# Patient Record
Sex: Female | Born: 1935
Health system: Southern US, Community
[De-identification: ages and names within clinical notes are randomized; demographics above are authoritative.]

## PROBLEM LIST (undated history)

## (undated) DIAGNOSIS — E785 Hyperlipidemia, unspecified: Secondary | ICD-10-CM

## (undated) DIAGNOSIS — I519 Heart disease, unspecified: Secondary | ICD-10-CM

## (undated) DIAGNOSIS — R0902 Hypoxemia: Secondary | ICD-10-CM

## (undated) DIAGNOSIS — M1990 Unspecified osteoarthritis, unspecified site: Secondary | ICD-10-CM

## (undated) DIAGNOSIS — I10 Essential (primary) hypertension: Secondary | ICD-10-CM

## (undated) DIAGNOSIS — I252 Old myocardial infarction: Secondary | ICD-10-CM

## (undated) HISTORY — DX: Old myocardial infarction: I25.2

## (undated) HISTORY — DX: Heart disease, unspecified: I51.9

## (undated) HISTORY — PX: TUBAL LIGATION: SHX77

## (undated) HISTORY — DX: Hyperlipidemia, unspecified: E78.5

## (undated) HISTORY — DX: Hypoxemia: R09.02

## (undated) HISTORY — DX: Unspecified osteoarthritis, unspecified site: M19.90

---

## 2004-12-12 ENCOUNTER — Emergency Department: Payer: Self-pay | Admitting: Emergency Medicine

## 2006-11-09 ENCOUNTER — Emergency Department: Payer: Self-pay | Admitting: Internal Medicine

## 2007-02-05 ENCOUNTER — Ambulatory Visit: Payer: Self-pay | Admitting: Internal Medicine

## 2009-07-16 ENCOUNTER — Emergency Department: Payer: Self-pay | Admitting: Internal Medicine

## 2010-02-14 ENCOUNTER — Ambulatory Visit: Payer: Self-pay | Admitting: Internal Medicine

## 2010-03-03 ENCOUNTER — Inpatient Hospital Stay: Payer: Self-pay | Admitting: Internal Medicine

## 2010-03-06 ENCOUNTER — Encounter: Payer: Self-pay | Admitting: Cardiovascular Disease

## 2010-05-30 ENCOUNTER — Ambulatory Visit: Payer: Self-pay | Admitting: Gastroenterology

## 2010-12-18 ENCOUNTER — Emergency Department: Payer: Self-pay | Admitting: Emergency Medicine

## 2011-03-05 ENCOUNTER — Ambulatory Visit: Payer: Self-pay | Admitting: Internal Medicine

## 2011-12-05 ENCOUNTER — Emergency Department: Payer: Self-pay | Admitting: *Deleted

## 2011-12-05 LAB — URINALYSIS, COMPLETE
Bilirubin,UR: NEGATIVE
Blood: NEGATIVE
Glucose,UR: NEGATIVE mg/dL (ref 0–75)
Ketone: NEGATIVE
Leukocyte Esterase: NEGATIVE
Ph: 6 (ref 4.5–8.0)
Specific Gravity: 1.001 (ref 1.003–1.030)

## 2012-03-06 ENCOUNTER — Ambulatory Visit: Payer: Self-pay | Admitting: Internal Medicine

## 2012-05-03 ENCOUNTER — Inpatient Hospital Stay: Payer: Self-pay | Admitting: Student

## 2012-05-03 LAB — CBC
HGB: 13.6 g/dL (ref 12.0–16.0)
MCH: 29.3 pg (ref 26.0–34.0)
Platelet: 228 10*3/uL (ref 150–440)
RBC: 4.63 10*6/uL (ref 3.80–5.20)
WBC: 8.1 10*3/uL (ref 3.6–11.0)

## 2012-05-03 LAB — CK TOTAL AND CKMB (NOT AT ARMC)
CK, Total: 43 U/L (ref 21–215)
CK-MB: 1.1 ng/mL (ref 0.5–3.6)
CK-MB: 0.8 ng/mL (ref 0.5–3.6)

## 2012-05-03 LAB — TROPONIN I
Troponin-I: 0.09 ng/mL — ABNORMAL HIGH
Troponin-I: 0.17 ng/mL — ABNORMAL HIGH

## 2012-05-03 LAB — COMPREHENSIVE METABOLIC PANEL
Anion Gap: 5 — ABNORMAL LOW (ref 7–16)
Calcium, Total: 8.9 mg/dL (ref 8.5–10.1)
Chloride: 101 mmol/L (ref 98–107)
Co2: 33 mmol/L — ABNORMAL HIGH (ref 21–32)
EGFR (African American): >60
EGFR (Non-African Amer.): >60
Osmolality: 281 (ref 275–301)
Potassium: 3.9 mmol/L (ref 3.5–5.1)
Sodium: 139 mmol/L (ref 136–145)

## 2012-05-03 LAB — PRO B NATRIURETIC PEPTIDE: B-Type Natriuretic Peptide: 150 pg/mL (ref 0–450)

## 2012-05-04 LAB — LIPID PANEL
Cholesterol: 157 mg/dL (ref 0–200)
HDL Cholesterol: 58 mg/dL (ref 40–60)
Triglycerides: 93 mg/dL (ref 0–200)
VLDL Cholesterol, Calc: 19 mg/dL (ref 5–40)

## 2012-05-04 LAB — BASIC METABOLIC PANEL
BUN: 14 mg/dL (ref 7–18)
Calcium, Total: 8.9 mg/dL (ref 8.5–10.1)
Chloride: 100 mmol/L (ref 98–107)
Co2: 35 mmol/L — ABNORMAL HIGH (ref 21–32)
EGFR (African American): >60
Glucose: 122 mg/dL — ABNORMAL HIGH (ref 65–99)
Osmolality: 277 (ref 275–301)
Potassium: 3.6 mmol/L (ref 3.5–5.1)
Sodium: 138 mmol/L (ref 136–145)

## 2012-05-04 LAB — CBC WITH DIFFERENTIAL/PLATELET
Basophil #: 0.1 10*3/uL (ref 0.0–0.1)
Basophil %: 1.3 %
Eosinophil #: 0 10*3/uL (ref 0.0–0.7)
HGB: 13.6 g/dL (ref 12.0–16.0)
Lymphocyte %: 28.2 %
MCH: 29.6 pg (ref 26.0–34.0)
MCHC: 32.9 g/dL (ref 32.0–36.0)
Neutrophil #: 5.2 10*3/uL (ref 1.4–6.5)
Neutrophil %: 62 %
Platelet: 209 10*3/uL (ref 150–440)

## 2012-05-04 LAB — HEMOGLOBIN A1C: Hemoglobin A1C: 6.2 % (ref 4.2–6.3)

## 2012-05-04 LAB — CK TOTAL AND CKMB (NOT AT ARMC)
CK, Total: 39 U/L (ref 21–215)
CK-MB: 1 ng/mL (ref 0.5–3.6)

## 2012-05-04 LAB — TROPONIN I: Troponin-I: 0.13 ng/mL — ABNORMAL HIGH

## 2013-02-18 ENCOUNTER — Emergency Department: Payer: Self-pay | Admitting: Emergency Medicine

## 2013-03-29 ENCOUNTER — Ambulatory Visit: Payer: Self-pay | Admitting: Internal Medicine

## 2014-05-20 ENCOUNTER — Ambulatory Visit: Payer: Self-pay | Admitting: Internal Medicine

## 2015-02-19 NOTE — H&P (Signed)
PATIENT NAME:  Samantha Cooper, Ysabela J MR#:  [id number] DATE OF BIRTH:  October 15, 1935  DATE OF ADMISSION:  05/03/2012  REFERRING PHYSICIAN: Dr. Carollee MassedKaminski   PRIMARY CARE PHYSICIAN: Toy CookeyErnest Eason, MD   CARDIOLOGIST: Dr. Juliann Paresallwood    CHIEF COMPLAINT: Chest pain.   HISTORY OF PRESENT ILLNESS: The patient is a pleasant 80 year old female with history of CAD status post stenting to the LAD in 2011 by Dr. Juliann Paresallwood, multi vessel CAD, hypertension, obesity, and hyperlipidemia who presents with the above chief complaint. The patient states that her symptoms started several days ago but could not tell me exactly when it started. The chest pain is exacerbated with eating and drinking fluids. The chest pain comes and goes and is substernal. There is no radiation. She states that she was told if she had any other chest pains please come to the ED after her MI and stenting in 2011 and that's why she is here. On initial evaluation she was noted to have elevated troponin of 0.09. Furthermore, the patient complains of shortness of breath that started several days ago as well. This is mostly on exertion. She has no weight gain, however, states has three pillow orthopnea. She has the chest pain if she lays flat. She has chronic lower extremity edema but does not have any weight gain. She has no fevers, chills, cough. Hospitalist services were contacted for further evaluation and management.   PAST MEDICAL HISTORY:  1. Obesity. 2. Hyperlipidemia. 3. Hypertension. 4. Coronary artery disease, multivessel in nature, status post PCI to LAD.   PAST SURGICAL HISTORY:  1. Tubal ligation. 2. Hysterectomy.  3. Cholecystectomy.   FAMILY HISTORY: Kidney disease and high blood pressure.   SOCIAL HISTORY: Previous smoker, not now, quit decades ago. No alcohol or drug use.   MEDICATIONS:  1. Atenolol 25 mg daily.  2. Aspirin 81 mg daily.  3. Clopidogrel 75 mg daily. 4. Dexilant 60 mg daily.  5. Diltiazem 360 mg extended-release  daily.  6. HCTZ/losartan 25/100 mg daily.  7. Simvastatin 40 mg daily.   ALLERGIES: Codeine.   REVIEW OF SYSTEMS: CONSTITUTIONAL: No fever, fatigue, weakness, or weight gain. EYES: No blurry vision, double vision, or glaucoma. ENT: Denies tinnitus or hearing loss. RESPIRATORY: Shortness of breath, dyspnea on exertion. No cough, wheezing, hemoptysis, or history of COPD. CARDIOVASCULAR: Chest pain as above. Has three pillow orthopnea, chronic lower extremity edema. No arrhythmia. No palpitations. History of high blood pressure. GI: No nausea, vomiting, diarrhea, hematemesis, rectal bleeding. GU: Denies dysuria, frequency, or incontinence. ENDOCRINE: Denies polyuria, nocturia. HEME/LYMPH: Denies anemia or easy bruising. SKIN: Denies any rashes. MUSCULOSKELETAL: Has chronic arthritis in knees. NEUROLOGIC: Denies focal weakness, numbness, CVA or TIA. PSYCHIATRIC: Denies anxiety or insomnia.   PHYSICAL EXAMINATION:   VITAL SIGNS: Temperature 98.1, pulse rate 64 on arrival, respiratory rate 18, blood pressure 149/63, oxygen sat 95% on room air.   GENERAL: The patient is a morbidly obese female sitting in bed in no obvious distress talking in full sentences.   HEENT: Normocephalic, atraumatic. Pupils are equal and reactive. Anicteric sclerae. Moist mucous membranes. Extraocular muscles intact.   NECK: Supple. No JVD or lymphadenopathy in submandibular or cervical regions.   LUNGS: Clear to auscultation without wheezing or rhonchi.   CARDIOVASCULAR: S1, S2, distant heart sounds. No murmurs appreciated.   ABDOMEN: Soft, nontender, obese. No palpable organomegaly noted. Positive bowel sounds in all quadrants.   EXTREMITIES: 2+ pitting edema to below the knees.   NEUROLOGICAL: Cranial nerves II through XII grossly  intact. Strength is 5/5 in all extremities.   PSYCH: Awake, alert, oriented x3, pleasant, cooperative.   SKIN: No obvious rashes.   LABORATORY, DIAGNOSTIC, AND RADIOLOGICAL DATA: BUN  21, creatinine 0.78, sodium 139, potassium 3.9, CO2 33, anion gap 5, albumin 3.3, ALT 9, AST 16. Troponin 0.09. CK-MB 1.1. CK total 43. WBC 8.1, hemoglobin 13.6, hematocrit 41.4, platelets 228.   X-ray of the chest no acute disease of the chest, one view image.   EKG shows normal sinus rhythm, rate 60. No acute ST elevations or depressions.   ASSESSMENT AND PLAN: We have a pleasant 80 year old obese female with history of coronary artery disease and cath in May of 2011 with stenting to LAD, hyperlipidemia, obesity, and hypertension presenting with chest pain and found with elevated troponins consistent with possible NSTEMI. The patient has multiple risk factors including multivessel disease which was found on a cath in 2011 and has history of stenting. She is on an aspirin and a Plavix which we'll continue. We would cycle cardiac markers and resume her other medications including Plavix, beta-blocker, statin, and aspirin as well as nitroglycerin p.r.n. There is no chest pain now. I would also consult with Cardiology, Dr. Juliann Pares, and order an echocardiogram. Of note, the patient has other atypical features as well including recurrence of the pain with eating or drinking. This makes me think of possible GI issue. I would hold the patient's Dexilant for now and start the patient on PPI b.i.d. and obtain a GI consult. The patient does have shortness of breath and dyspnea on exertion which started several days ago as well. She has chronic lower extremity edema. She also complains of some orthopnea. This makes me concerned for possible underlying CHF. Again, echocardiogram will be ordered. I would give a dose of Lasix and be evaluated in the morning. Get strict ins and outs as well as daily weights. I would resume the patient's outpatient medications. The patient does not appear to be in florid CHF or significant volume overload at this point.   In regards to her CAD, please see above. She has no chest pains  now.   In regards to high blood pressure, we would continue her outpatient medications and resume the statin and check a lipid profile for her hyperlipidemia.   CODE STATUS: FULL CODE.   TOTAL TIME SPENT: 55 minutes.   ____________________________ Krystal Eaton, MD sa:drc D: 05/03/2012 14:58:41 ET T: 05/04/2012 06:28:53 ET JOB#: [id number]  cc: Krystal Eaton, MD, <Dictator> Serita Sheller. Maryellen Pile, MD Dwayne D. Juliann Pares, MD Krystal Eaton MD ELECTRONICALLY SIGNED 05/18/2012 16:16

## 2015-02-19 NOTE — Consult Note (Signed)
PATIENT NAME:  Samantha LlanosHARRELSON, Gilberta J MR#:  [id number] DATE OF BIRTH:  Nov 05, 1934  DATE OF CONSULTATION:  05/03/2012  REFERRING PHYSICIAN:   CONSULTING PHYSICIAN:  Ezzard StandingPaul Y. Ronelle Michie, MD  REASON FOR REFERRAL: Atypical chest pain.   DISCUSSION: The patient is a 80 year old female with known history of coronary artery disease who supposedly had a stent placed in 2011. The patient started having this chest pain several days ago that typically worsens after eating or drinking fluids. The pain is intermittent and is substernal. She denies having typical heartburn-like symptoms. There is no radiation of the symptoms. She denies dysphagia. She also has been feeling short of breath even with minimal exertion. She was given a green pill daily without relief. She could not tell me which medicine this was. She also takes aspirin and Plavix daily.   REVIEW OF SYSTEMS: There are no fevers or chills. There are weight changes. There is no nausea or vomiting. There is no palpitations or coughing. The GI symptoms have been described already. There is no problems with diarrhea, constipation, gross hematochezia or melena. The rest of the review of symptoms is noncontributory.   PAST MEDICAL HISTORY:  1. Obesity.  2. Hypertension. 3. Hyperlipidemia. 4. Coronary artery disease.   PAST SURGICAL HISTORY: 1. Hysterectomy. 2. Cholecystectomy.  3. Tubal ligation.  FAMILY HISTORY: High blood pressure and kidney disease.   HOME MEDICATIONS: Aspirin, Plavix, Dexilant, diltiazem, losartan, Zocor, and atenolol.   DRUG ALLERGIES: No known drug allergies.   PHYSICAL EXAMINATION:   GENERAL: The patient is in no acute distress.   VITAL SIGNS: She is afebrile. Vital signs are stable.   HEENT: Normocephalic, atraumatic head. Pupils are equally reactive. Throat was clear.   NECK: Supple.   CARDIAC: Regular rhythm and rate without murmurs.   LUNGS: Clear bilaterally.   ABDOMEN: Normoactive bowel sounds. It was soft and  nontender throughout. There is no hepatomegaly. There are no palpable masses.   EXTREMITIES: No clubbing, cyanosis, or cyanosis. There is pitting edema below the knees.   NEUROLOGICAL: Nonfocal.   SKIN: Negative.   LABORATORY/RADIOLOGICAL DATA: Troponin level is elevated at 0.09. Hemoglobin is 13.6 and white count is 8.1. Electrolytes showed sodium of 139, potassium 3.9, and CO2 33.   X-ray was negative.   EKG showed normal sinus rhythm.   ASSESSMENT AND PLAN: This is a patient with atypical chest pain. This pain is more worse with eating and swallowing. Nevertheless, troponin level is elevated. The patient has a known cardiac history.       We will await cardiology input. If the patient is stable cardiac-wise, I can proceed with EGD tomorrow afternoon. Because of the aspirin and Plavix use, the patient was made aware that there is increased risk of bleeding if biopsies or dilation has to be performed. Thank you for the referral.  ____________________________ Ezzard StandingPaul Y. Bluford Kaufmannh, MD pyo:slb D: 05/07/2012 13:54:37 ET T: 05/07/2012 15:00:31 ET JOB#: [id number]  cc: Ezzard StandingPaul Y. Bluford Kaufmannh, MD, <Dictator> Ezzard StandingPAUL Y Citlally Captain MD ELECTRONICALLY SIGNED 05/13/2012 11:43

## 2015-02-19 NOTE — Consult Note (Signed)
Full consult to follow. Intermittent CP mainly after eating. NO dysphagia. No heartburn. SOB on min exertion. Known hx of CAD thought cardiac cath results unknown according to patient. Given green pill daily without relief. On ASA/plavix. Elevated troponin level. Await cardiology input. If stable cardiac wise, can proceed with EGD tomorrow afternoon. At increased risk of bleeding if bx or dilation needs to be done. Will follow. Thanks.  Electronic Signatures: Lutricia Feilh, Edvardo Honse (MD)  (Signed on 07-Jul-13 18:28)  Authored  Last Updated: 07-Jul-13 18:28 by Lutricia Feilh, Asaiah Hunnicutt (MD)

## 2015-02-19 NOTE — Consult Note (Signed)
PATIENT NAME:  Samantha Cooper, Novis J MR#:  [id number] DATE OF BIRTH:  21-Jan-1935  DATE OF CONSULTATION:  05/04/2012  REFERRING PHYSICIAN:  Dr. Maryellen PileEason, Prime Doc CONSULTING PHYSICIAN:  Dakwon Wenberg D. Chanoch Mccleery, MD  INDICATION: Possible chest pain, known coronary artery disease with hypertension.   HISTORY OF PRESENT ILLNESS:  [name] is a 80 year old morbidly obese black female with history of coronary artery disease status post angioplasty and stenting of a LAD in 2011. She has had multivessel disease but was treated medically after the stent. She has hypertension, obesity, and hyperlipidemia. She has been doing reasonably well, but complains of recent indigestion feeling. After eating she has had some pains in her chest. She denied any typical angina. Her troponin was slightly elevated at 0.09. She has had some trouble with shortness of breath with dyspnea on exertion, so with her recurrent chest pain symptom, she was admitted for further evaluation and care and possible evaluation with gastrointestinal. She may be preop for a scope.   REVIEW OF SYSTEMS: No blackout spells or syncope. Denies nausea or vomiting. Denies fever, chills, sweats, weight loss, weight gain, hemoptysis or hematemesis. No bright red blood per rectum. No vision change. No hearing change. No sputum production or cough.   PAST MEDICAL HISTORY:  1. Obesity.  2. Hyperlipidemia.  3. Hypertension.  4. Coronary artery disease.  5. Angina.   PAST SURGICAL HISTORY:  1. Tubal ligation.  2. Hysterectomy.  3. Cholecystectomy.   FAMILY HISTORY: Renal disease, hypertension.   SOCIAL HISTORY: Previous smoker, quit years ago. No alcohol consumption.   MEDICATIONS:  1. Atenolol 25. 2. Aspirin 81. 3. Clopidogrel 75. 4. Dexilant 60 mg a day.  5. Diltiazem 360 a day.  6. HCTZ/losartan 25/100 daily.  7. Simvastatin 40.   ALLERGIES: Codeine.   PHYSICAL EXAMINATION:  VITAL SIGNS: Blood pressure 150/60, pulse 65, respiratory rate  18, afebrile.   HEENT: Normocephalic, atraumatic. Pupils are reactive to light.   NECK: Supple. No significant jugular venous distention, bruits, or adenopathy.   LUNGS: Clear to auscultation.  No significant wheeze, rhonchi, or rale.   HEART: Regular rate and rhythm. Positive S4. Systolic ejection murmur at the apex. PMI is nondisplaced.   ABDOMEN: Benign.   EXTREMITY: Within normal limits.   NEUROLOGIC: Intact.   LABORATORY, RADIOLOGICAL AND DIAGNOSTIC DATA: BUN 21, creatinine 0.78, sodium 139, potassium 3.9. LFTs negative. CK was normal. White count 8.1, hemoglobin 13.6, hematocrit 41.4, platelet count 228. Chest x-ray negative. EKG: Sinus rhythm, nonspecific ST-T changes.   ASSESSMENT:  1. Possible chest pain. 2. Possible reflux. 3. Hypertension. 4. Hyperlipidemia. 5. Obesity. 6. Known coronary artery disease. 7. Elevated troponin.   PLAN:  1. Agree with admit.  2. Rule out for myocardial infarction.  3. Follow up EKGs.  4. Follow up chest x-ray.  5. Follow telemetry.  6. Continue current medications.  7. Would proceed with scope, but do not recommend any further cardiac work-up at this point. This is atypical pain and proton pump inhibitor should be continued. If scope is unremarkable, then would consider further evaluation from a cardiac standpoint. Treat the patient medically.   ____________________________ Bobbie Stackwayne D. Juliann Paresallwood, MD ddc:ap D: 05/04/2012 16:59:46 ET T: 05/05/2012 08:40:35 ET JOB#: [id number]  cc: Kamree Wiens D. Juliann Paresallwood, MD, <Dictator> Alwyn PeaWAYNE D Gurjit Loconte MD ELECTRONICALLY SIGNED 05/19/2012 9:13

## 2015-02-19 NOTE — Consult Note (Signed)
Chief Complaint:   Subjective/Chief Complaint Pt feels better. No CP today.   VITAL SIGNS/ANCILLARY NOTES: **Vital Signs.:   08-Jul-13 07:39   Vital Signs Type Q 4hr   Temperature Temperature (F) 98.7   Celsius 37   Temperature Source oral   Pulse Pulse 70   Respirations Respirations 20   Systolic BP Systolic BP 117   Diastolic BP (mmHg) Diastolic BP (mmHg) 68   Mean BP 84   Pulse Ox % Pulse Ox % 92   Pulse Ox Activity Level  At rest   Oxygen Delivery Room Air/ 21 %   Pulse Ox Heart Rate 70   Brief Assessment:   Cardiac Regular    Respiratory clear BS    Gastrointestinal Normal   Lab Results: Routine Chem:  08-Jul-13 03:11    Cholesterol, Serum -   Triglycerides, Serum -   HDL (INHOUSE) -   VLDL Cholesterol Calculated -   LDL Cholesterol Calculated - (Result(s) reported on 04 May 2012 at 04:11AM.)   Magnesium, Serum - (1.8-2.4 THERAPEUTIC RANGE: 4-7 mg/dL TOXIC: > 10 mg/dL  -----------------------)   Glucose, Serum -   BUN -   Creatinine (comp) -   Sodium, Serum -   Potassium, Serum -   Chloride, Serum -   CO2, Serum -   Calcium (Total), Serum -   Anion Gap -   Osmolality (calc) -   eGFR (African American) -   eGFR (Non-African American) - (eGFR values <60mL/min/1.73 m2 may be an indication of chronic kidney disease (CKD). Calculated eGFR is useful in patients with stable renal function. The eGFR calculation will not be reliable in acutely ill patients when serum creatinine is changing rapidly. It is not useful in  patients on dialysis. The eGFR calculation may not be applicable to patients at the low and high extremes of body sizes, pregnant women, and vegetarians.)   Result Comment METB/LIPID/MAGNESIUM - TESTS RUN UNDER WRONG SPECIMENT TYPE  - NOTIFIED RN ALICIA SCOTT. WILL REORDER  - AND RERUN...TPL  Result(s) reported on 04 May 2012 at 03:27AM.   Assessment/Plan:  Assessment/Plan:   Assessment Atypical CP. Sxs do not appear cardiac. Elevated  troponin levels.    Plan Discussed with hospitalist. Will proceed with EGD later this afternoon. THanks.   Electronic Signatures: Oh, Paul (MD)  (Signed 08-Jul-13 14:18)  Authored: Chief Complaint, VITAL SIGNS/ANCILLARY NOTES, Brief Assessment, Lab Results, Assessment/Plan   Last Updated: 08-Jul-13 14:18 by Oh, Paul (MD) 

## 2015-02-19 NOTE — Discharge Summary (Signed)
PATIENT NAME:  Samantha Cooper, Samantha Cooper MR#:  [id number] DATE OF BIRTH:  May 03, 1935  DATE OF ADMISSION:  05/03/2012 DATE OF DISCHARGE:  05/05/2012  ADMITTING DIAGNOSES: Chest pain.   DISCHARGE DIAGNOSES:  1. Chest pain felt to be atypical, the patient was having chest pain with eating and noted to have esophageal stricture on EGD and is status post dilation.  2. Known coronary artery disease based on cardiac catheterization done on 03/05/2010 which showed multivessel coronary artery disease.  There was a stent placed to the mid LAD.  3. Elevated troponin likely due to some demand ischemia as well as underlying known coronary artery disease.  4. Mild shortness of breath without any evidence of congestive heart failure. Chest x-ray is negative. Echocardiogram shows normal ejection fraction.  5. Morbid obesity.  6. Hyperlipidemia.  7. Hypertension.  8. Status post tubal ligation.  9. Status post hysterectomy.  10. Status post cholecystectomy.   CONSULTANTS:  1. Dorothyann Peng, MD. 2. Lutricia Feil, MD.  PERTINENT LABORATORY, DIAGNOSTIC AND RADIOLOGIC DATA: BUN was 21, creatinine 0.78, sodium 139, potassium 3.9, CO2 33, anion gap 5, albumin 3.3, ALT 9, and AST 16. Troponin 0.09. CK-MB 1.1 and CPK 43. WBC 8.1, hemoglobin 13.6, and platelet count 228.   Chest x-ray showed no acute abnormality.   EKG showed normal sinus rhythm without any ST-T wave changes.   Echocardiogram showed normal ejection fraction without any wall motion abnormality.   Fasting lipid panel: Total cholesterol 60, triglycerides were elevated at 376, and LDL 80.   PROCEDURES: EGD showed a benign stricture, status post dilation.   HOSPITAL COURSE: Please refer to the history and physical done by the admitting physician. The patient is a 80 year old female with history of known coronary artery disease and had a stent in 2011 who presented with complaint of having chest pain which she reported was worse with eating and drinking  fluids. The pain was coming and going. She was seen in the Emergency Department. She was noted to have troponins that were slightly elevated. With her coronary artery disease, she was admitted for further evaluation. In light of her symptoms, it was felt that these were atypical and felt to be GI related. Therefore both cardiology and gastroenterology consults were obtained. She was seen by Dr. Juliann Pares who stated that if the EGD is normal then he would consider further cardiac evaluation. The patient's symptoms resolved by the next day. She was seen by Dr. Bluford Kaufmann who went ahead and did an EGD which showed a benign esophageal stricture which was dilated. The patient is currently asymptomatic and is very anxious to go home. She has ambulated without any difficulties. Currently she is stable for discharge. If she has recurrent chest pain then will likely need further cardiac catheterization to evaluate her coronaries. At this time, she is stable for discharge.   DISCHARGE MEDICATIONS:  1. Diltiazem 360 mg daily.  2. HCTZ/losartan 25/100 mg one tab p.o. daily.  3. Plavix 75 mg p.o. daily.  4. Simvastatin 40 mg at bedtime. 5. Dexilant 60 mg daily.  6. Atenolol 25 mg daily.  7. Aspirin 81 mg one tab p.o. daily.   HOME OXYGEN: None.   DIET: Low sodium.   ACTIVITY: As tolerated.        TIMEFRAME FOR FOLLOW-UP: Followup in 1 to 2 weeks with Dr. Maryellen Pile and Dr. Juliann Pares as scheduled. If symptoms recur, need to follow up earlier.  TIME SPENT: 35 minutes.  ____________________________ Lacie Scotts Allena Katz, MD shp:slb D: 05/05/2012  11:07:13 ET T: 05/05/2012 12:25:24 ET JOB#: [id number]  cc: Alonzo Owczarzak H. Allena KatzPatel, MD, <Dictator> Charise CarwinSHREYANG H Kaladin Noseworthy MD ELECTRONICALLY SIGNED 05/08/2012 17:51

## 2015-02-19 NOTE — Consult Note (Signed)
EGD showed mild GE junction stricture. 54 Fr Maloney dilator passed. 1 medium sized polyp in stomach. 1 bx taken. Soft diet ordered. Daily PPI for now. Thanks.  Electronic Signatures: Lutricia Feilh, Jakeisha Stricker (MD)  (Signed on 08-Jul-13 17:04)  Authored  Last Updated: 08-Jul-13 17:04 by Lutricia Feilh, Shaundra Fullam (MD)

## 2017-09-16 ENCOUNTER — Other Ambulatory Visit: Payer: Self-pay

## 2017-09-16 ENCOUNTER — Encounter: Payer: Self-pay | Admitting: Nurse Practitioner

## 2017-09-16 ENCOUNTER — Ambulatory Visit (INDEPENDENT_AMBULATORY_CARE_PROVIDER_SITE_OTHER): Payer: Medicare Other | Admitting: Nurse Practitioner

## 2017-09-16 VITALS — BP 124/62 | HR 51 | Temp 98.2°F | Ht 65.0 in | Wt 251.4 lb

## 2017-09-16 DIAGNOSIS — E782 Mixed hyperlipidemia: Secondary | ICD-10-CM

## 2017-09-16 DIAGNOSIS — I2510 Atherosclerotic heart disease of native coronary artery without angina pectoris: Secondary | ICD-10-CM

## 2017-09-16 DIAGNOSIS — Z7689 Persons encountering health services in other specified circumstances: Secondary | ICD-10-CM | POA: Diagnosis not present

## 2017-09-16 DIAGNOSIS — M150 Primary generalized (osteo)arthritis: Secondary | ICD-10-CM

## 2017-09-16 DIAGNOSIS — Z23 Encounter for immunization: Secondary | ICD-10-CM

## 2017-09-16 DIAGNOSIS — M8949 Other hypertrophic osteoarthropathy, multiple sites: Secondary | ICD-10-CM

## 2017-09-16 DIAGNOSIS — I10 Essential (primary) hypertension: Secondary | ICD-10-CM

## 2017-09-16 DIAGNOSIS — Z6841 Body Mass Index (BMI) 40.0 and over, adult: Secondary | ICD-10-CM | POA: Diagnosis not present

## 2017-09-16 DIAGNOSIS — K219 Gastro-esophageal reflux disease without esophagitis: Secondary | ICD-10-CM | POA: Diagnosis not present

## 2017-09-16 DIAGNOSIS — M159 Polyosteoarthritis, unspecified: Secondary | ICD-10-CM

## 2017-09-16 DIAGNOSIS — R0902 Hypoxemia: Secondary | ICD-10-CM | POA: Diagnosis not present

## 2017-09-16 MED ORDER — LOSARTAN POTASSIUM 25 MG PO TABS: 25.0000 mg | ORAL_TABLET | Freq: Every day | ORAL | 1 refills | Status: DC

## 2017-09-16 MED ORDER — BLOOD PRESSURE CUFF MISC: 1.0000 | Freq: Every day | 0 refills | Status: DC

## 2017-09-16 MED ORDER — TORSEMIDE 100 MG PO TABS: 100.0000 mg | ORAL_TABLET | Freq: Every day | ORAL | 1 refills | Status: DC

## 2017-09-16 MED ORDER — DEXLANSOPRAZOLE 60 MG PO CPDR: 60.0000 mg | DELAYED_RELEASE_CAPSULE | Freq: Every day | ORAL | 1 refills | Status: DC

## 2017-09-16 MED ORDER — DILTIAZEM HCL ER BEADS 360 MG PO CP24: 360.0000 mg | ORAL_CAPSULE | Freq: Every day | ORAL | 1 refills | Status: DC

## 2017-09-16 MED ORDER — CLOPIDOGREL BISULFATE 75 MG PO TABS: 75.0000 mg | ORAL_TABLET | Freq: Every day | ORAL | 1 refills | Status: DC

## 2017-09-16 MED ORDER — ATENOLOL 25 MG PO TABS: 25.0000 mg | ORAL_TABLET | Freq: Every day | ORAL | 1 refills | Status: DC

## 2017-09-16 NOTE — Patient Instructions (Addendum)
Samantha Cooper, Thank you for coming in to clinic today.  1. No changes in medications today.  2. I will request records from Dr. Eason.  Please schedule a follow-up appointment with [name], AGNP. [name] in about 3 months (around 12/17/2017) for Blood pressure, acid reflux.  If you have any other questions or concerns, please feel free to call the clinic or send a message through MyChart. You may also schedule an earlier appointment if necessary.  You will receive a survey after today's visit either digitally by e-mail or paper by [employer] mail. Your experiences and feedback matter to us.  Please respond so we know how we are doing as we provide care for you.    Wilhelmina McardleLauren Pink Maye, DNP, AGNP-BC Adult Gerontology Nurse Practitioner [facility], [facility]

## 2017-09-16 NOTE — Progress Notes (Signed)
Subjective:    Patient ID: Samantha Cooper, female    DOB: 12-May-1935, 82 y.o.   MRN: [id number]  Samantha Cooper is a 82 y.o. female presenting on 09/16/2017 for Establish Care (pt transferring her care from Samantha Cooper )   HPI Establish Care New Provider Pt last seen by PCP about 3 months ago.  Obtain records from Samantha Cooper.   Osteoarthritis bilateral knees - Awaiting knee replacement - Was told she needed to lose 70 lbs total, but now states she needs to lose 40 more lbs.  Last known weight was 280 lbs.  Is eating smaller servings, avoiding sweets and fried foods.  Baking foods much more.  Morbid Obesity Pt cooks and prepares own meals.   - Frequently uses canned fruits, green vegetables.  Is watching sodium.  Eating fish, [place] more frequently and some chicken, rarely eats steak. Exercise: Physical therapy routine of exercises.  History of GERD: Pt is taking Dexilant 60 mg once daily and is having no breakthrough symptoms.  Hypertension - She is not checking BP at home or outside of clinic.    - Current medications: atenolol 25 mg once daily, losartan 25 mg once daily, diltiazem 360 mg 24 hr tab once daily, torsemide 100 mg once daily, tolerating well without side effects - She is not currently symptomatic. - Pt denies headache, lightheadedness, dizziness, changes in vision, chest tightness/pressure, palpitations, leg swelling, sudden loss of speech or loss of consciousness. - She  reports an exercise routine that includes Physical therapy exercises, 7 days per week. - Her diet is moderate in salt, low in fat, and moderate in carbohydrates. - Has occasional incontinence w/ diuretic, but no usual incontinence. - O2  2L uses only at night.  Past Medical History:  Diagnosis Date  . Heart disease   . History of heart attack   . Hyperlipidemia   . Osteoarthritis 09/26/2017   Past Surgical History:  Procedure Laterality Date  . TUBAL LIGATION     Social History    Socioeconomic History  . Marital status: Divorced    Spouse name: Not on file  . Number of children: Not on file  . Years of education: Not on file  . Highest education level: Not on file  Social Needs  . Financial resource strain: Not on file  . Food insecurity - worry: Not on file  . Food insecurity - inability: Not on file  . Transportation needs - medical: Not on file  . Transportation needs - non-medical: Not on file  Occupational History  . Not on file  Tobacco Use  . Smoking status: Former Smoker    Packs/day: 1.50    Years: 10.00    Pack years: 15.00  . Smokeless tobacco: Former Neurosurgeon    Types: Snuff  Substance and Sexual Activity  . Alcohol use: Not on file  . Drug use: No  . Sexual activity: Not on file  Other Topics Concern  . Not on file  Social History Narrative  . Not on file   History reviewed. No pertinent family history. Current Outpatient Medications on File Prior to Visit  Medication Sig  . aspirin 81 MG tablet aspirin 81 mg tablet,delayed release  take 1 tablet by mouth once daily   No current facility-administered medications on file prior to visit.     Review of Systems  HENT: Negative.   Eyes: Negative.   Respiratory: Positive for shortness of breath.   Cardiovascular: Negative.   Gastrointestinal: Negative.  Endocrine: Negative.   Genitourinary: Negative for difficulty urinating (urge incontinence).  Musculoskeletal: Positive for arthralgias and back pain.  Skin: Negative.   Allergic/Immunologic: Negative.   Neurological: Positive for weakness.  Hematological: Negative.   Psychiatric/Behavioral: Negative.    Per HPI unless specifically indicated above     Objective:    BP 124/62   Pulse (!) 51   Temp 98.2 F (36.8 C) (Oral)   Ht 5\' 5"  (1.651 m)   Wt 251 lb 6.4 oz (114 kg)   SpO2 91%   BMI 41.84 kg/m     Wt Readings from Last 3 Encounters:  09/16/17 251 lb 6.4 oz (114 kg)    Physical Exam   General - morbidly obese,  well-appearing, NAD  HEENT - Normocephalic, atraumatic Neck - supple, non-tender, no LAD, no thyromegaly, no carotid bruit Heart - RRR, no murmurs heard Lungs - Clear throughout all lobes, no wheezing, crackles, or rhonchi. Normal work of breathing. Extremeties - non-tender, +1 pitting pedal edema, cap refill < 2 seconds, peripheral pulses intact +2 bilaterally Skin - warm, dry Neuro - awake, alert, oriented x3, antalgic gait Psych - Normal mood and affect, normal behavior   No results found for this or any previous visit.    Assessment & Plan:   Problem List Items Addressed This Visit      Cardiovascular and Mediastinum   Hypertension - Primary    Controlled hypertension.  Chronic problem that has been managed with multiple medications and has no side effects.  Patient does not have blood pressure cuff at home.  Is adhering to mostly low-salt diet but is required to eat canned foods related to income.  Patient with good knowledge of what low-salt diet means.  Plan:  1. Continue atenolol 25 mg once daily 2.  Continue losartan 25 mg once daily 3.  Continue diltiazem 360 mg 24-hour tab once daily 4.  Continue torsemide 100 mg once daily.  Advised patient to take earlier in the daytime but did not remember to take it every day even if being away from home.  Would prefer more gentle diuretic.  Consider chlorthalidone versus hydrochlorothiazide at next visit. 5.  Consider repeat echocardiogram or EKG if patient does develop symptoms or has changes in hypertension control. 6.  Follow-up 3 months.      Relevant Medications   aspirin 81 MG tablet   atenolol (TENORMIN) 25 MG tablet   diltiazem (TIAZAC) 360 MG 24 hr capsule   losartan (COZAAR) 25 MG tablet   torsemide (DEMADEX) 100 MG tablet   Coronary artery disease involving native coronary artery of native heart without angina pectoris    Patient with coronary artery disease without angina who has been stable without issues for several  years.  She no longer has connection with cardiology but had previously seen Dr. Juliann Cooper.  Patient is taking Plavix and other medications to manage hypertension and possible CHF with presence of torsemide on her med list.  CHF diagnosis is unknown.  Plan: 1.  Continue Plavix until records can be reviewed.  Consider changing Plavix if last stent or PCI has been greater than 1 year ago.  We will need to add additional cholesterol medication at that point.  Patient has advanced age uses a walker and has high fall risks of Plavix should be avoided if not necessary. 2.  Continue hypertension management. 3.  Follow-up as needed and every year.      Relevant Medications   aspirin 81 MG tablet  atenolol (TENORMIN) 25 MG tablet   clopidogrel (PLAVIX) 75 MG tablet   diltiazem (TIAZAC) 360 MG 24 hr capsule   losartan (COZAAR) 25 MG tablet   torsemide (DEMADEX) 100 MG tablet     Respiratory   Hypoxia    Patient reports hypoxia.  No medical records to be reviewed today for cause of hypoxia.  Suspect may be sleep apnea but patient does not currently use CPAP and has not had recent sleep study.  She does use 2 L oxygen each night at bedtime.  Since using oxygen she states she feels much better.  Hypoxia may also be related to large body habitus and hypoventilation syndrome.  Plan: 1.  Consider sleep study in future.   2.  Continue home O2 at this time. 3.  Follow-up as needed.        Digestive   GERD (gastroesophageal reflux disease)   Relevant Medications   dexlansoprazole (DEXILANT) 60 MG capsule     Musculoskeletal and Integument   Osteoarthritis    Chronic problem with chronic pain worst in bilateral knees.  Patient has visited orthopedics and had consultation and is awaiting knee replacement but needs to lose weight.  She is already 30 pounds towards her goal for a total of 70 pounds of weight loss.  Plan: 1.  Continue management with orthopedics 2.  Continue work towards weight loss.   Continue with smaller servings and avoiding sweets and fried foods. 3.  Follow-up as needed.      Relevant Medications   aspirin 81 MG tablet     Other   Morbid obesity with BMI of 40.0-44.9, adult [facility])    Patient with morbid obesity BMI 41.8 today.  Patient is motivated to lose weight and has been losing weight over the last several months.  Plan: 1.  Encourage patient to continue physical activity of approximately 30 minutes most days of the week.  She has been doing a physical therapy routine even after physical therapy has completed. 2.  Reviewed healthy eating habits and work to identify where patient can improve even more. 3.  Reviewed short-term and long-term goals.  Patient has short-term goal of an additional 40 pounds for goal and long-term goal of an additional 50 pounds. 4.  Follow-up as needed.      Hyperlipidemia    Chronic condition with status unknown. Patient not currently taking any meds.  Last labs were performed by Samantha Cooper and are unavailable today.  Plan: 1.  Request records from Dr. Jake Church office. 2.  Encourage patient to continue regular physical activity and healthy diet. 3.  Recheck lab lipid panel at next appointment. 4.  Follow-up in 3 months.      Relevant Medications   aspirin 81 MG tablet   atenolol (TENORMIN) 25 MG tablet   diltiazem (TIAZAC) 360 MG 24 hr capsule   losartan (COZAAR) 25 MG tablet   torsemide (DEMADEX) 100 MG tablet    Other Visit Diagnoses    Encounter to establish care       Patient last seen by Dr. Caffie Damme in 3 months ago.  Obtain records.  Medical history has been reviewed with patient today.   Need for immunization against influenza       Patient age greater than 60.  Administer high-dose Fluzone vaccine today.   Relevant Orders   Flu vaccine HIGH DOSE PF (Fluzone High dose) (Completed)      Meds ordered this encounter  Medications  . atenolol (TENORMIN) 25 MG  tablet    Sig: Take 1 tablet (25 mg total) by mouth daily.      Dispense:  90 tablet    Refill:  1    Order Specific Question:   Supervising Provider    Answer:   Smitty CordsKARAMALEGOS, ALEXANDER J [[id number]]  . clopidogrel (PLAVIX) 75 MG tablet    Sig: Take 1 tablet (75 mg total) by mouth daily.    Dispense:  90 tablet    Refill:  1    Order Specific Question:   Supervising Provider    Answer:   Smitty CordsKARAMALEGOS, ALEXANDER J [[id number]]  . dexlansoprazole (DEXILANT) 60 MG capsule    Sig: Take 1 capsule (60 mg total) by mouth daily.    Dispense:  90 capsule    Refill:  1    Order Specific Question:   Supervising Provider    Answer:   Smitty CordsKARAMALEGOS, ALEXANDER J [[id number]]  . diltiazem (TIAZAC) 360 MG 24 hr capsule    Sig: Take 1 capsule (360 mg total) by mouth daily.    Dispense:  90 capsule    Refill:  1    Order Specific Question:   Supervising Provider    Answer:   Smitty CordsKARAMALEGOS, ALEXANDER J [[id number]]  . losartan (COZAAR) 25 MG tablet    Sig: Take 1 tablet (25 mg total) by mouth daily.    Dispense:  90 tablet    Refill:  1    Order Specific Question:   Supervising Provider    Answer:   Smitty CordsKARAMALEGOS, ALEXANDER J [[id number]]  . torsemide (DEMADEX) 100 MG tablet    Sig: Take 1 tablet (100 mg total) by mouth daily.    Dispense:  90 tablet    Refill:  1    Order Specific Question:   Supervising Provider    Answer:   Smitty CordsKARAMALEGOS, ALEXANDER J [[id number]]  . Blood Pressure Monitoring (BLOOD PRESSURE CUFF) MISC    Sig: 1 Device by Does not apply route daily.    Dispense:  1 each    Refill:  0    Talking BP machine with wrist cuff    Order Specific Question:   Supervising Provider    Answer:   Smitty CordsKARAMALEGOS, ALEXANDER J [[id number]]      Follow up plan: Return in about 3 months (around 12/17/2017) for Blood pressure, acid reflux.  Wilhelmina McardleLauren Jeanann Balinski, DNP, AGPCNP-BC Adult Gerontology Primary Care Nurse Practitioner [facility] 09/26/2017, 1:17 PM

## 2017-09-26 ENCOUNTER — Encounter: Payer: Self-pay | Admitting: Nurse Practitioner

## 2017-09-26 DIAGNOSIS — I10 Essential (primary) hypertension: Secondary | ICD-10-CM | POA: Insufficient documentation

## 2017-09-26 DIAGNOSIS — E785 Hyperlipidemia, unspecified: Secondary | ICD-10-CM | POA: Insufficient documentation

## 2017-09-26 DIAGNOSIS — R0902 Hypoxemia: Secondary | ICD-10-CM | POA: Insufficient documentation

## 2017-09-26 DIAGNOSIS — I25119 Atherosclerotic heart disease of native coronary artery with unspecified angina pectoris: Secondary | ICD-10-CM | POA: Insufficient documentation

## 2017-09-26 DIAGNOSIS — K219 Gastro-esophageal reflux disease without esophagitis: Secondary | ICD-10-CM | POA: Insufficient documentation

## 2017-09-26 DIAGNOSIS — M1990 Unspecified osteoarthritis, unspecified site: Secondary | ICD-10-CM

## 2017-09-26 DIAGNOSIS — Z6841 Body Mass Index (BMI) 40.0 and over, adult: Secondary | ICD-10-CM

## 2017-09-26 HISTORY — DX: Unspecified osteoarthritis, unspecified site: M19.90

## 2017-09-26 NOTE — Assessment & Plan Note (Signed)
Controlled hypertension.  Chronic problem that has been managed with multiple medications and has no side effects.  Patient does not have blood pressure cuff at home.  Is adhering to mostly low-salt diet but is required to eat canned foods related to income.  Patient with good knowledge of what low-salt diet means.  Plan:  1. Continue atenolol 25 mg once daily 2.  Continue losartan 25 mg once daily 3.  Continue diltiazem 360 mg 24-hour tab once daily 4.  Continue torsemide 100 mg once daily.  Advised patient to take earlier in the daytime but did not remember to take it every day even if being away from home.  Would prefer more gentle diuretic.  Consider chlorthalidone versus hydrochlorothiazide at next visit. 5.  Consider repeat echocardiogram or EKG if patient does develop symptoms or has changes in hypertension control. 6.  Follow-up 3 months.

## 2017-09-26 NOTE — Assessment & Plan Note (Signed)
Patient with coronary artery disease without angina who has been stable without issues for several years.  She no longer has connection with cardiology but had previously seen Dr. Juliann Paresallwood.  Patient is taking Plavix and other medications to manage hypertension and possible CHF with presence of torsemide on her med list.  CHF diagnosis is unknown.  Plan: 1.  Continue Plavix until records can be reviewed.  Consider changing Plavix if last stent or PCI has been greater than 1 year ago.  We will need to add additional cholesterol medication at that point.  Patient has advanced age uses a walker and has high fall risks of Plavix should be avoided if not necessary. 2.  Continue hypertension management. 3.  Follow-up as needed and every year.

## 2017-09-26 NOTE — Assessment & Plan Note (Signed)
Chronic condition with status unknown. Patient not currently taking any meds.  Last labs were performed by Dr. Maryellen PileEason and are unavailable today.  Plan: 1.  Request records from Dr. Jake ChurchEason's office. 2.  Encourage patient to continue regular physical activity and healthy diet. 3.  Recheck lab lipid panel at next appointment. 4.  Follow-up in 3 months.

## 2017-09-26 NOTE — Assessment & Plan Note (Signed)
Chronic problem with chronic pain worst in bilateral knees.  Patient has visited orthopedics and had consultation and is awaiting knee replacement but needs to lose weight.  She is already 30 pounds towards her goal for a total of 70 pounds of weight loss.  Plan: 1.  Continue management with orthopedics 2.  Continue work towards weight loss.  Continue with smaller servings and avoiding sweets and fried foods. 3.  Follow-up as needed.

## 2017-09-26 NOTE — Assessment & Plan Note (Signed)
Patient reports hypoxia.  No medical records to be reviewed today for cause of hypoxia.  Suspect may be sleep apnea but patient does not currently use CPAP and has not had recent sleep study.  She does use 2 L oxygen each night at bedtime.  Since using oxygen she states she feels much better.  Hypoxia may also be related to large body habitus and hypoventilation syndrome.  Plan: 1.  Consider sleep study in future.   2.  Continue home O2 at this time. 3.  Follow-up as needed.

## 2017-09-26 NOTE — Assessment & Plan Note (Signed)
Patient with morbid obesity BMI 41.8 today.  Patient is motivated to lose weight and has been losing weight over the last several months.  Plan: 1.  Encourage patient to continue physical activity of approximately 30 minutes most days of the week.  She has been doing a physical therapy routine even after physical therapy has completed. 2.  Reviewed healthy eating habits and work to identify where patient can improve even more. 3.  Reviewed short-term and long-term goals.  Patient has short-term goal of an additional 40 pounds for goal and long-term goal of an additional 50 pounds. 4.  Follow-up as needed.

## 2017-12-23 ENCOUNTER — Ambulatory Visit: Payer: Medicare Other | Admitting: Nurse Practitioner

## 2017-12-29 ENCOUNTER — Telehealth: Payer: Self-pay | Admitting: Nurse Practitioner

## 2017-12-30 ENCOUNTER — Other Ambulatory Visit: Payer: Self-pay

## 2017-12-30 ENCOUNTER — Encounter: Payer: Self-pay | Admitting: Nurse Practitioner

## 2017-12-30 ENCOUNTER — Ambulatory Visit (INDEPENDENT_AMBULATORY_CARE_PROVIDER_SITE_OTHER): Payer: Medicare Other | Admitting: Nurse Practitioner

## 2017-12-30 ENCOUNTER — Ambulatory Visit (INDEPENDENT_AMBULATORY_CARE_PROVIDER_SITE_OTHER): Payer: Medicare Other

## 2017-12-30 VITALS — BP 130/64 | HR 78 | Temp 97.9°F | Resp 18 | Ht 65.0 in | Wt 272.0 lb

## 2017-12-30 VITALS — BP 130/64 | HR 78 | Temp 97.9°F | Resp 17 | Ht 65.0 in | Wt 272.0 lb

## 2017-12-30 DIAGNOSIS — Z0000 Encounter for general adult medical examination without abnormal findings: Secondary | ICD-10-CM

## 2017-12-30 DIAGNOSIS — K219 Gastro-esophageal reflux disease without esophagitis: Secondary | ICD-10-CM | POA: Diagnosis not present

## 2017-12-30 DIAGNOSIS — I10 Essential (primary) hypertension: Secondary | ICD-10-CM | POA: Diagnosis not present

## 2017-12-30 DIAGNOSIS — E782 Mixed hyperlipidemia: Secondary | ICD-10-CM

## 2017-12-30 DIAGNOSIS — L989 Disorder of the skin and subcutaneous tissue, unspecified: Secondary | ICD-10-CM | POA: Diagnosis not present

## 2017-12-30 DIAGNOSIS — R799 Abnormal finding of blood chemistry, unspecified: Secondary | ICD-10-CM

## 2017-12-30 DIAGNOSIS — Z6841 Body Mass Index (BMI) 40.0 and over, adult: Secondary | ICD-10-CM

## 2017-12-30 DIAGNOSIS — L720 Epidermal cyst: Secondary | ICD-10-CM

## 2017-12-30 DIAGNOSIS — I2510 Atherosclerotic heart disease of native coronary artery without angina pectoris: Secondary | ICD-10-CM

## 2017-12-30 MED ORDER — LIDOCAINE 5 % EX OINT: 1.0000 "application " | TOPICAL_OINTMENT | CUTANEOUS | 0 refills | Status: DC | PRN

## 2017-12-30 NOTE — Patient Instructions (Addendum)
Adie, Thank you for coming in to clinic today.  1. You are being referred to Dr. Ebony CargoBenitez Graham in [place].  She will do a skin assessment and remove your cyst and skin lesions. - For the pain, you may apply a lidocaine gel 3-4 times daily until it is removed.  2. Continue medications as previously ordered.  No changes today.  3. Labs today: we will call with your results.  Please schedule a follow-up appointment with Wilhelmina McardleLauren Anay Walter, AGNP. Return in about 6 months (around 07/02/2018) for Hypertension, hyperlipidemia, GERD.  If you have any other questions or concerns, please feel free to call the clinic or send a message through MyChart. You may also schedule an earlier appointment if necessary.  You will receive a survey after today's visit either digitally by e-mail or paper by [employer] mail. Your experiences and feedback matter to us.  Please respond so we know how we are doing as we provide care for you.   Wilhelmina McardleLauren Leeyah Heather, DNP, AGNP-BC Adult Gerontology Nurse Practitioner [facility], [facility]

## 2017-12-30 NOTE — Progress Notes (Signed)
Subjective:    Patient ID: Samantha Cooper, female    DOB: [birth year], 83 y.o.   MRN: [id number]  Samantha Cooper is a 83 y.o. female presenting on 12/30/2017 for Hypertension and Cyst (painful area on the front of the right calf for several years,)   HPI Hypertension - She is not checking BP at home or outside of clinic.    - Current medications: atenolol, diltiazem, losartan, tolerating well without side effects - She is not currently symptomatic. - Pt denies headache, lightheadedness, dizziness, changes in vision, chest tightness/pressure, palpitations, leg swelling, sudden loss of speech or loss of consciousness. - She  reports no regular exercise routine. - Her diet is moderate in salt, moderate in fat, and moderate in carbohydrates.   Cyst Painful cyst on anterior left calf medial to tibia.  Pt has red, raised bump that has worsened over last several years and would now like it to be removed.  No drainage is noted and no significant peri-wound swelling has occurred.  Social History   Tobacco Use  . Smoking status: Former Smoker    Packs/day: 1.50    Years: 10.00    Pack years: 15.00  . Smokeless tobacco: Former Neurosurgeon    Types: Snuff  . Tobacco comment: unsure when she quit   Substance Use Topics  . Alcohol use: No    Frequency: Never  . Drug use: No    Review of Systems Per HPI unless specifically indicated above     Objective:    BP 130/64 (BP Location: Right Arm, Patient Position: Sitting, Cuff Size: Large)   Pulse 78   Temp 97.9 F (36.6 C) (Oral)   Resp 18   Ht 5\' 5"  (1.651 m)   Wt 272 lb (123.4 kg)   BMI 45.26 kg/m   Wt Readings from Last 3 Encounters:  01/15/18 272 lb (123.4 kg)  12/30/17 272 lb (123.4 kg)  12/30/17 272 lb (123.4 kg)    Physical Exam General - morbidly obese, well-appearing, NAD HEENT - Normocephalic, atraumatic Neck - supple, non-tender, no LAD, no carotid bruit Heart - RRR, no murmurs heard Lungs - Clear throughout all  lobes, no wheezing, crackles, or rhonchi. Normal work of breathing. Extremeties - non-tender, +1 pedal edema, cap refill < 2 seconds, peripheral pulses intact +2 bilaterally Skin - warm, dry, 3 lesions: A) R lateral lower leg adjacent to knee has mild scabbing from abrasion B) Left medial lower leg over medial knee joint has mild scabbing from abrasion C) Left medial lower leg below knee: Cyst approx 2 cm round, induration without fluctuance, mobile, tender to palpation Neuro - awake, alert, oriented x3,  normal gait Psych - Normal mood and affect, normal behavior   Results for orders placed or performed in visit on 12/30/17  COMPLETE METABOLIC PANEL WITH GFR  Result Value Ref Range   Glucose, Bld 112 (H) 65 - 99 mg/dL   BUN 15 7 - 25 mg/dL   Creat 7.82 9.56 - 2.13 mg/dL   GFR, Est Non African American 71 > OR = 60 mL/min/1.4m2   GFR, Est African American 82 > OR = 60 mL/min/1.58m2   BUN/Creatinine Ratio NOT APPLICABLE 6 - 22 (calc)   Sodium 141 135 - 146 mmol/L   Potassium 3.8 3.5 - 5.3 mmol/L   Chloride 97 (L) 98 - 110 mmol/L   CO2 36 (H) 20 - 32 mmol/L   Calcium 9.6 8.6 - 10.4 mg/dL   Total Protein 7.3 6.1 -  8.1 g/dL   Albumin 3.9 3.6 - 5.1 g/dL   Globulin 3.4 1.9 - 3.7 g/dL (calc)   AG Ratio 1.1 1.0 - 2.5 (calc)   Total Bilirubin 0.5 0.2 - 1.2 mg/dL   Alkaline phosphatase (APISO) 110 33 - 130 U/L   AST 24 10 - 35 U/L   ALT 15 6 - 29 U/L  Lipid panel  Result Value Ref Range   Cholesterol 205 (H) <200 mg/dL   HDL 57 >16 mg/dL   Triglycerides 109 <604 mg/dL   LDL Cholesterol (Calc) 127 (H) mg/dL (calc)   Total CHOL/HDL Ratio 3.6 <5.0 (calc)   Non-HDL Cholesterol (Calc) 148 (H) <130 mg/dL (calc)  Hemoglobin V4U  Result Value Ref Range   Hgb A1c MFr Bld 6.0 (H) <5.7 % of total Hgb   Mean Plasma Glucose 126 (calc)   eAG (mmol/L) 7.0 (calc)      Assessment & Plan:   Problem List Items Addressed This Visit      Cardiovascular and Mediastinum   Hypertension Controlled  hypertension.  BP goal < 130/80.  Pt is not working on lifestyle modifications.  Taking medications tolerating well without side effects. No currently identified complications except CAD, HLD.  Plan: 1. Continue taking meds without changes 2. Obtain labs CMP, Lipid A1c  3. Encouraged heart healthy diet and increasing exercise to 30 minutes most days of the week. 4. Check BP 1-2 x per week at home, keep log, and bring to clinic at next appointment. 5. Follow up 3 months.     Relevant Orders   COMPLETE METABOLIC PANEL WITH GFR (Completed)   Lipid panel (Completed)   Coronary artery disease involving native coronary artery of native heart without angina pectoris   Relevant Orders   COMPLETE METABOLIC PANEL WITH GFR (Completed)   Lipid panel (Completed)     Digestive   GERD (gastroesophageal reflux disease)   Relevant Orders   COMPLETE METABOLIC PANEL WITH GFR (Completed)   Lipid panel (Completed)     Other   Morbid obesity with BMI of 40.0-44.9, adult (HCC) Pt with obesity, htn, and hld.  No recent screening for A1c.  Collect today.   Relevant Orders   Hemoglobin A1c   Hyperlipidemia Pt tolerating medications well.  No recent reevaluation of treatment status despite no s/sx ASCVD events over last 6 months.  Plan: 1. CMP, Lipid, A1c 2. Continue medications without changes. 3. Encouraged healthy diet and continuing to exercise regularly. 4. Followup 6 months and for med changes after labs today.   Relevant Orders   COMPLETE METABOLIC PANEL WITH GFR (Completed)   Lipid panel (Completed)   Hemoglobin A1c    Other Visit Diagnoses    Generalized subcutaneous cysts    -  Primary Non-complicated generalized cysts.  Pt with need to have dermatologist visit as will be difficult to perform I&D in clinic for the current presentation.  Plan: 1. Apply lidocaine ointment prn up to four times daily for pain at site of cyst. 2. Referral Dr. Cheree Ditto in [facility] for dermatology. 3. Followup as  needed.   Relevant Medications   lidocaine (XYLOCAINE) 5 % ointment   Other Relevant Orders   Ambulatory referral to Dermatology   Non-healing skin lesion       Relevant Orders   Ambulatory referral to Dermatology   Abnormal blood chemistry       Relevant Orders   Hemoglobin A1c      Meds ordered this encounter  Medications  . lidocaine (  XYLOCAINE) 5 % ointment    Sig: Apply 1 application topically as needed.    Dispense:  35.44 g    Refill:  0    Order Specific Question:   Supervising Provider    Answer:   Smitty CordsKARAMALEGOS, ALEXANDER J [[id number]]    Follow up plan: Return in about 6 months (around 07/02/2018) for Hypertension, hyperlipidemia, GERD.  Wilhelmina McardleLauren Nazaret Chea, DNP, AGPCNP-BC Adult Gerontology Primary Care Nurse Practitioner [facility] 01/21/2018, 3:28 PM

## 2017-12-30 NOTE — Progress Notes (Signed)
Subjective:   Samantha Cooper is a 83 y.o. female who presents for an Initial Medicare Annual Wellness Visit.  Review of Systems      Cardiac Risk Factors include: hypertension;advanced age (>7955men, 24>65 women);obesity (BMI >30kg/m2);dyslipidemia     Objective:    Today's Vitals   12/30/17 0913  BP: 130/64  Pulse: 78  Resp: 17  Temp: 97.9 F (36.6 C)  TempSrc: Oral  Weight: 272 lb (123.4 kg)  Height: 5\' 5"  (1.651 m)   Body mass index is 45.26 kg/m.  Advanced Directives 12/30/2017  Does Patient Have a Medical Advance Directive? No  Would patient like information on creating a medical advance directive? Yes (MAU/Ambulatory/Procedural Areas - Information given)    Current Medications (verified) Outpatient Encounter Medications as of 12/30/2017  Medication Sig  . aspirin 81 MG tablet aspirin 81 mg tablet,delayed release  take 1 tablet by mouth once daily  . atenolol (TENORMIN) 25 MG tablet Take 1 tablet (25 mg total) by mouth daily.  . Blood Pressure Monitoring (BLOOD PRESSURE CUFF) MISC 1 Device by Does not apply route daily.  . clopidogrel (PLAVIX) 75 MG tablet Take 1 tablet (75 mg total) by mouth daily.  Marland Kitchen. dexlansoprazole (DEXILANT) 60 MG capsule Take 1 capsule (60 mg total) by mouth daily.  Marland Kitchen. diltiazem (TIAZAC) 360 MG 24 hr capsule Take 1 capsule (360 mg total) by mouth daily.  Marland Kitchen. losartan (COZAAR) 25 MG tablet Take 1 tablet (25 mg total) by mouth daily.  Marland Kitchen. torsemide (DEMADEX) 100 MG tablet Take 1 tablet (100 mg total) by mouth daily.   No facility-administered encounter medications on file as of 12/30/2017.     Allergies (verified) Codeine   History: Past Medical History:  Diagnosis Date  . Heart disease   . History of heart attack   . Hyperlipidemia   . Osteoarthritis 09/26/2017   Past Surgical History:  Procedure Laterality Date  . TUBAL LIGATION     Family History  Problem Relation Age of Onset  . Mental illness Brother    Social History    Socioeconomic History  . Marital status: Divorced    Spouse name: None  . Number of children: None  . Years of education: None  . Highest education level: None  Social Needs  . Financial resource strain: Not hard at all  . Food insecurity - worry: Never true  . Food insecurity - inability: Never true  . Transportation needs - medical: No  . Transportation needs - non-medical: No  Occupational History  . None  Tobacco Use  . Smoking status: Former Smoker    Packs/day: 1.50    Years: 10.00    Pack years: 15.00  . Smokeless tobacco: Former NeurosurgeonUser    Types: Snuff  . Tobacco comment: unsure when she quit   Substance and Sexual Activity  . Alcohol use: No    Frequency: Never  . Drug use: No  . Sexual activity: None  Other Topics Concern  . None  Social History Narrative  . None    Tobacco Counseling Counseling given: Not Answered Comment: unsure when she quit    Clinical Intake:  Pre-visit preparation completed: Yes  Pain : No/denies pain     Nutritional Status: BMI > 30  Obese Nutritional Risks: None Diabetes: No  How often do you need to have someone help you when you read instructions, pamphlets, or other written materials from your doctor or pharmacy?: 1 - Never What is the last grade level you  completed in school?: 7th grade  Interpreter Needed?: No  Information entered by :: Tiffany Hill,LPN    Activities of Daily Living In your present state of health, do you have any difficulty performing the following activities: 12/30/2017 09/16/2017  Hearing? Y N  Vision? N Y  Difficulty concentrating or making decisions? N N  Walking or climbing stairs? Y Y  Comment avoid stairs  -  Dressing or bathing? N Y  Doing errands, shopping? N Y  Quarry manager and eating ? N -  Using the Toilet? N -  In the past six months, have you accidently leaked urine? Y -  Comment depends -  Do you have problems with loss of bowel control? N -  Managing your Medications? N -   Managing your Finances? Y -  Comment daughter does -  Medical laboratory scientific officer? N -  Some recent data might be hidden     Immunizations and Health Maintenance Immunization History  Administered Date(s) Administered  . Influenza, High Dose Seasonal PF 09/16/2017   Health Maintenance Due  Topic Date Due  . TETANUS/TDAP  03/22/1954  . PNA vac Low Risk Adult (1 of 2 - PCV13) 03/22/2002    Patient Care Team: Galen Manila, NP as PCP - General (Nurse Practitioner)  Indicate any recent Medical Services you may have received from other than [facility] providers in the past year (date may be approximate).     Assessment:   This is a routine wellness examination for [facility].  Hearing/Vision screen Vision Screening Comments: Goes [facility]  Dietary issues and exercise activities discussed: Current Exercise Habits: The patient does not participate in regular exercise at present, Exercise limited by: None identified  Goals    . DIET - INCREASE WATER INTAKE     Recommend drinking at least 6-8 glasses of water a day       Depression Screen PHQ 2/9 Scores 12/30/2017 09/16/2017  PHQ - 2 Score 0 0    Fall Risk Fall Risk  12/30/2017 09/16/2017  Falls in the past year? Yes No  Number falls in past yr: 2 or more -  Injury with Fall? No -  Risk Factor Category  High Fall Risk -  Risk for fall due to : History of fall(s);Impaired balance/gait -  Follow up Falls evaluation completed;Falls prevention discussed -    Is the patient's home free of loose throw rugs in walkways, pet beds, electrical cords, etc?   yes      Grab bars in the bathroom? no      Handrails on the stairs?   no      Adequate lighting?   yes  Timed Get Up and Go Performed Completed in 12 seconds with  use of assistive devices- cane, steady gait. No intervention needed at this time.   Cognitive Function:     6CIT Screen 12/30/2017  What Year? 0 points  What month? 0 points   What time? 0 points  Count back from 20 0 points  Months in reverse 2 points  Repeat phrase 2 points  Total Score 4    Screening Tests Health Maintenance  Topic Date Due  . TETANUS/TDAP  03/22/1954  . PNA vac Low Risk Adult (1 of 2 - PCV13) 03/22/2002  . DEXA SCAN  12/31/2018 (Originally 03/22/2002)  . INFLUENZA VACCINE  Completed    Declined pneumonia vaccine   Qualifies for Shingles Vaccine? Yes, discussed shingrix vaccine   Cancer Screenings: Lung:  Low Dose CT Chest recommended if Age 17-80 years, 30 pack-year currently smoking OR have quit w/in 15years. Patient does not qualify. Breast: Up to date on Mammogram? Yes   Up to date of Bone Density/Dexa? No declined Colorectal: no longer needed  Additional Screenings:  Hepatitis B/HIV/Syphillis: not indicated Hepatitis C Screening: not indicated     Plan:    I have personally reviewed and addressed the Medicare Annual Wellness questionnaire and have noted the following in the patient's chart:  A. Medical and social history B. Use of alcohol, tobacco or illicit drugs  C. Current medications and supplements D. Functional ability and status E.  Nutritional status F.  Physical activity G. Advance directives H. List of other physicians I.  Hospitalizations, surgeries, and ER visits in previous 12 months J.  Vitals K. Screenings such as hearing and vision if needed, cognitive and depression L. Referrals and appointments   In addition, I have reviewed and discussed with patient certain preventive protocols, quality metrics, and best practice recommendations. A written personalized care plan for preventive services as well as general preventive health recommendations were provided to patient.   Signed,  Marin Roberts, LPN Nurse Health Advisor   Nurse Notes:none

## 2017-12-30 NOTE — Patient Instructions (Signed)
[name] , Thank you for taking time to come for your Medicare Wellness Visit. I appreciate your ongoing commitment to your health goals. Please review the following plan we discussed and let me know if I can assist you in the future.   Screening recommendations/referrals: Colonoscopy: no longer required Mammogram: no longer required Bone Density: declined Recommended yearly ophthalmology/optometry visit for glaucoma screening and checkup Recommended yearly dental visit for hygiene and checkup  Vaccinations: Influenza vaccine: up to date Pneumococcal vaccine: due- declined Tdap vaccine: due, check with your insurance company for coverage  Shingles vaccine: eligible for shingrix vaccine, check with your insurance for coverage- can get at pharmacy  Advanced directives: Advance directive discussed with you today. I have provided a copy for you to complete at home and have notarized. Once this is complete please bring a copy in to our office so we can scan it into your chart.  Conditions/risks identified: Recommend drinking at least 6-8 glasses of water a day Next appointment: Follow up in one year for your annual wellness exam.    Preventive Care 65 Years and Older, Female Preventive care refers to lifestyle choices and visits with your health care provider that can promote health and wellness. What does preventive care include?  A yearly physical exam. This is also called an annual well check.  Dental exams once or twice a year.  Routine eye exams. Ask your health care provider how often you should have your eyes checked.  Personal lifestyle choices, including:  Daily care of your teeth and gums.  Regular physical activity.  Eating a healthy diet.  Avoiding tobacco and drug use.  Limiting alcohol use.  Practicing safe sex.  Taking low-dose aspirin every day.  Taking vitamin and mineral supplements as recommended by your health care provider. What happens during an  annual well check? The services and screenings done by your health care provider during your annual well check will depend on your age, overall health, lifestyle risk factors, and family history of disease. Counseling  Your health care provider may ask you questions about your:  Alcohol use.  Tobacco use.  Drug use.  Emotional well-being.  Home and relationship well-being.  Sexual activity.  Eating habits.  History of falls.  Memory and ability to understand (cognition).  Work and work Astronomerenvironment.  Reproductive health. Screening  You may have the following tests or measurements:  Height, weight, and BMI.  Blood pressure.  Lipid and cholesterol levels. These may be checked every 5 years, or more frequently if you are over 81 years old.  Skin check.  Lung cancer screening. You may have this screening every year starting at age 81 if you have a 30-pack-year history of smoking and currently smoke or have quit within the past 15 years.  Fecal occult blood test (FOBT) of the stool. You may have this test every year starting at age 81.  Flexible sigmoidoscopy or colonoscopy. You may have a sigmoidoscopy every 5 years or a colonoscopy every 10 years starting at age 81.  Hepatitis C blood test.  Hepatitis B blood test.  Sexually transmitted disease (STD) testing.  Diabetes screening. This is done by checking your blood sugar (glucose) after you have not eaten for a while (fasting). You may have this done every 1-3 years.  Bone density scan. This is done to screen for osteoporosis. You may have this done starting at age 81.  Mammogram. This may be done every 1-2 years. Talk to your health care provider  about how often you should have regular mammograms. Talk with your health care provider about your test results, treatment options, and if necessary, the need for more tests. Vaccines  Your health care provider may recommend certain vaccines, such as:  Influenza  vaccine. This is recommended every year.  Tetanus, diphtheria, and acellular pertussis (Tdap, Td) vaccine. You may need a Td booster every 10 years.  Zoster vaccine. You may need this after age 78.  Pneumococcal 13-valent conjugate (PCV13) vaccine. One dose is recommended after age 78.  Pneumococcal polysaccharide (PPSV23) vaccine. One dose is recommended after age 102. Talk to your health care provider about which screenings and vaccines you need and how often you need them. This information is not intended to replace advice given to you by your health care provider. Make sure you discuss any questions you have with your health care provider. Document Released: 11/10/2015 Document Revised: 07/03/2016 Document Reviewed: 08/15/2015 Elsevier Interactive Patient Education  2017 [place] Prevention in the Home Falls can cause injuries. They can happen to people of all ages. There are many things you can do to make your home safe and to help prevent falls. What can I do on the outside of my home?  Regularly fix the edges of walkways and driveways and fix any cracks.  Remove anything that might make you trip as you walk through a door, such as a raised step or threshold.  Trim any bushes or trees on the path to your home.  Use bright outdoor lighting.  Clear any walking paths of anything that might make someone trip, such as rocks or tools.  Regularly check to see if handrails are loose or broken. Make sure that both sides of any steps have handrails.  Any raised decks and porches should have guardrails on the edges.  Have any leaves, snow, or ice cleared regularly.  Use sand or salt on walking paths during winter.  Clean up any spills in your garage right away. This includes oil or grease spills. What can I do in the bathroom?  Use night lights.  Install grab bars by the toilet and in the tub and shower. Do not use towel bars as grab bars.  Use non-skid mats or decals  in the tub or shower.  If you need to sit down in the shower, use a plastic, non-slip stool.  Keep the floor dry. Clean up any water that spills on the floor as soon as it happens.  Remove soap buildup in the tub or shower regularly.  Attach bath mats securely with double-sided non-slip rug tape.  Do not have throw rugs and other things on the floor that can make you trip. What can I do in the bedroom?  Use night lights.  Make sure that you have a light by your bed that is easy to reach.  Do not use any sheets or blankets that are too big for your bed. They should not hang down onto the floor.  Have a firm chair that has side arms. You can use this for support while you get dressed.  Do not have throw rugs and other things on the floor that can make you trip. What can I do in the kitchen?  Clean up any spills right away.  Avoid walking on wet floors.  Keep items that you use a lot in easy-to-reach places.  If you need to reach something above you, use a strong step stool that has a grab bar.  Keep electrical cords out of the way.  Do not use floor polish or wax that makes floors slippery. If you must use wax, use non-skid floor wax.  Do not have throw rugs and other things on the floor that can make you trip. What can I do with my stairs?  Do not leave any items on the stairs.  Make sure that there are handrails on both sides of the stairs and use them. Fix handrails that are broken or loose. Make sure that handrails are as long as the stairways.  Check any carpeting to make sure that it is firmly attached to the stairs. Fix any carpet that is loose or worn.  Avoid having throw rugs at the top or bottom of the stairs. If you do have throw rugs, attach them to the floor with carpet tape.  Make sure that you have a light switch at the top of the stairs and the bottom of the stairs. If you do not have them, ask someone to add them for you. What else can I do to help  prevent falls?  Wear shoes that:  Do not have high heels.  Have rubber bottoms.  Are comfortable and fit you well.  Are closed at the toe. Do not wear sandals.  If you use a stepladder:  Make sure that it is fully opened. Do not climb a closed stepladder.  Make sure that both sides of the stepladder are locked into place.  Ask someone to hold it for you, if possible.  Clearly mark and make sure that you can see:  Any grab bars or handrails.  First and last steps.  Where the edge of each step is.  Use tools that help you move around (mobility aids) if they are needed. These include:  Canes.  Walkers.  Scooters.  Crutches.  Turn on the lights when you go into a dark area. Replace any light bulbs as soon as they burn out.  Set up your furniture so you have a clear path. Avoid moving your furniture around.  If any of your floors are uneven, fix them.  If there are any pets around you, be aware of where they are.  Review your medicines with your doctor. Some medicines can make you feel dizzy. This can increase your chance of falling. Ask your doctor what other things that you can do to help prevent falls. This information is not intended to replace advice given to you by your health care provider. Make sure you discuss any questions you have with your health care provider. Document Released: 08/10/2009 Document Revised: 03/21/2016 Document Reviewed: 11/18/2014 Elsevier Interactive Patient Education  2017 [employer].

## 2017-12-31 LAB — COMPLETE METABOLIC PANEL WITH GFR
AG Ratio: 1.1 (calc) (ref 1.0–2.5)
ALT: 15 U/L (ref 6–29)
AST: 24 U/L (ref 10–35)
Albumin: 3.9 g/dL (ref 3.6–5.1)
Alkaline phosphatase (APISO): 110 U/L (ref 33–130)
BUN: 15 mg/dL (ref 7–25)
CO2: 36 mmol/L — ABNORMAL HIGH (ref 20–32)
Calcium: 9.6 mg/dL (ref 8.6–10.4)
Chloride: 97 mmol/L — ABNORMAL LOW (ref 98–110)
Creat: 0.79 mg/dL (ref 0.60–0.88)
GFR, Est African American: 82 mL/min/{1.73_m2} (ref >=60)
GFR, Est Non African American: 71 mL/min/{1.73_m2} (ref >=60)
Globulin: 3.4 g/dL (calc) (ref 1.9–3.7)
Glucose, Bld: 112 mg/dL — ABNORMAL HIGH (ref 65–99)
Potassium: 3.8 mmol/L (ref 3.5–5.3)
Sodium: 141 mmol/L (ref 135–146)
Total Bilirubin: 0.5 mg/dL (ref 0.2–1.2)
Total Protein: 7.3 g/dL (ref 6.1–8.1)

## 2017-12-31 LAB — LIPID PANEL
Cholesterol: 205 mg/dL — ABNORMAL HIGH (ref <200)
HDL: 57 mg/dL (ref >50)
LDL Cholesterol (Calc): 127 mg/dL (calc) — ABNORMAL HIGH
Non-HDL Cholesterol (Calc): 148 mg/dL (calc) — ABNORMAL HIGH (ref <130)
Total CHOL/HDL Ratio: 3.6 (calc) (ref <5.0)
Triglycerides: 107 mg/dL (ref <150)

## 2017-12-31 LAB — HEMOGLOBIN A1C
Hgb A1c MFr Bld: 6 % of total Hgb — ABNORMAL HIGH (ref <5.7)
Mean Plasma Glucose: 126 (calc)
eAG (mmol/L): 7 (calc)

## 2018-01-01 NOTE — Telephone Encounter (Signed)
I attempted to contact the patient, no answer. LMOM to return my call. 

## 2018-01-01 NOTE — Telephone Encounter (Signed)
-----   Message from Lauren Renee Kennedy, NP sent at 01/01/2018 11:57 AM EST ----- LIPID PANEL: - Total cholesterol and LDL remain only slightly elevated.  - LDL is your bad cholesterol that can build up in your arteries and veins and lead to heart attack and stroke over time.  This can be managed with medication and diet changes.   - Your HDL is normal.  HDL is your good cholesterol.  When it is higher than 50, it can be protective against and prevent cholesterol buildup in your arteries that could lead to heart attack or stroke.   - You can lower your LDL and raise your HDL by exercising, by taking fish oil or by increasing your dietary omega-3 fatty acids.    ## RESTART simvastatin 10 mg once daily at your evening meal.   CMP: Your CMP is normal except slightly elevated glucose.  Glucose is at goal for you because you have diabetes.  Normal electrolytes, kidney function, and liver function.   Hgb A1C is at goal.  No treatment modification is required.  Continue working on eating a healthy diet and participating in regular physical activity as able.  

## 2018-01-01 NOTE — Telephone Encounter (Signed)
Attempted to contact the pt, no answer. LMOm to return my call.  

## 2018-01-05 ENCOUNTER — Telehealth: Payer: Self-pay

## 2018-01-05 ENCOUNTER — Other Ambulatory Visit: Payer: Self-pay

## 2018-01-05 NOTE — Patient Outreach (Signed)
[facility] Care Management  01/05/2018  Samantha LlanosMyrtle J Cooper [birth year] [id number]   TELEPHONE SCREENING Referral date: 12/30/17 Referral source: primary provider Referral reason: In home safety, risk of falling,  Social work assessment Insurance: [employer] care  Telephone call to patient regarding primary provider referral. HIPAA verified with patient.  [facility] and [facility]. Patient verbally agreed to services.  Patient states she is concerned about falling. Patient reports she uses her walker for ambulation. Patient states she has fallen at least 1 time within the past year. Patient states she has steps at her back door entrance with no rails.   Patient reports she does have a shower chair and bedside commode.  Patient states she would like to talk with a Child psychotherapistsocial worker about finding another place to live.   Patient states, "I am a heart failure patient. "  Patient states she sees Dr. Kyung RuddKennedy for follow up every 6 months. Patient denies having any recent hospitalization due to her heart failure. Patient states she weighs and records her weights daily. Patient states she feels like she is managing her heart failure ok.   PLAN: RNCm will refer patient to community case Production designer, theatre/television/filmmanager and Child psychotherapistsocial worker.   Samantha InaDavina Ahria Slappey RN,BSN,CCM [facility] Telephonic  [phone]

## 2018-01-05 NOTE — Telephone Encounter (Signed)
-----   Message from Galen ManilaLauren Renee Kennedy, NP sent at 01/01/2018 11:57 AM EST ----- LIPID PANEL: - Total cholesterol and LDL remain only slightly elevated.  - LDL is your bad cholesterol that can build up in your arteries and veins and lead to heart attack and stroke over time.  This can be managed with medication and diet changes.   - Your HDL is normal.  HDL is your good cholesterol.  When it is higher than 50, it can be protective against and prevent cholesterol buildup in your arteries that could lead to heart attack or stroke.   - You can lower your LDL and raise your HDL by exercising, by taking fish oil or by increasing your dietary omega-3 fatty acids.    ## RESTART simvastatin 10 mg once daily at your evening meal.   CMP: Your CMP is normal except slightly elevated glucose.  Glucose is at goal for you because you have diabetes.  Normal electrolytes, kidney function, and liver function.   Hgb A1C is at goal.  No treatment modification is required.  Continue working on eating a healthy diet and participating in regular physical activity as able.

## 2018-01-05 NOTE — Telephone Encounter (Signed)
The pt was notified. 

## 2018-01-05 NOTE — Telephone Encounter (Signed)
-----   Message from Lauren Renee Kennedy, NP sent at 01/01/2018 11:57 AM EST ----- LIPID PANEL: - Total cholesterol and LDL remain only slightly elevated.  - LDL is your bad cholesterol that can build up in your arteries and veins and lead to heart attack and stroke over time.  This can be managed with medication and diet changes.   - Your HDL is normal.  HDL is your good cholesterol.  When it is higher than 50, it can be protective against and prevent cholesterol buildup in your arteries that could lead to heart attack or stroke.   - You can lower your LDL and raise your HDL by exercising, by taking fish oil or by increasing your dietary omega-3 fatty acids.    ## RESTART simvastatin 10 mg once daily at your evening meal.   CMP: Your CMP is normal except slightly elevated glucose.  Glucose is at goal for you because you have diabetes.  Normal electrolytes, kidney function, and liver function.   Hgb A1C is at goal.  No treatment modification is required.  Continue working on eating a healthy diet and participating in regular physical activity as able.  

## 2018-01-06 ENCOUNTER — Other Ambulatory Visit: Payer: Self-pay

## 2018-01-06 ENCOUNTER — Other Ambulatory Visit: Payer: Self-pay | Admitting: Nurse Practitioner

## 2018-01-06 ENCOUNTER — Ambulatory Visit: Payer: Self-pay | Admitting: *Deleted

## 2018-01-06 DIAGNOSIS — E782 Mixed hyperlipidemia: Secondary | ICD-10-CM

## 2018-01-06 MED ORDER — ATORVASTATIN CALCIUM 20 MG PO TABS
20.0000 mg | ORAL_TABLET | Freq: Every day | ORAL | 3 refills | Status: DC
Start: 2018-01-06 — End: 2018-09-17

## 2018-01-06 MED ORDER — SIMVASTATIN 10 MG PO TABS: 10.0000 mg | ORAL_TABLET | Freq: Every day | ORAL | 3 refills | Status: DC

## 2018-01-08 ENCOUNTER — Other Ambulatory Visit: Payer: Self-pay | Admitting: *Deleted

## 2018-01-08 NOTE — Patient Outreach (Signed)
[facility] Care Management  01/08/2018  Samantha LlanosMyrtle J Cooper [birth year] [id number]   Patient referred to this social worker by telephonic RNCM to assist with completing her Advanced Directive, resources for housing and home repairs. Home visit scheduled for 01/15/18 at 10:30am to review the above needs.   Adriana ReamsChrystal Marine Lezotte, LCSW [facility] Care Management [phone]

## 2018-01-13 ENCOUNTER — Other Ambulatory Visit: Payer: Self-pay | Admitting: *Deleted

## 2018-01-13 NOTE — Patient Outreach (Signed)
Successful telephone encounter to [facility], 83 year old female- follow up on referral received 01/05/18  [facility] RN CM for Community CM services- needs home safety evaluation, risk of falling- steps leading to back door no rails. Spoke with pt, HIPAA identifiers  Verified, discussed purpose of call- follow up on referral,schedule home visit.    Plan:  As discussed with pt, plan to follow up later this week- initial home visit.   Shayne Alkenose M.   Danzell Birky RN CCM [facility] Care Management  [phone]

## 2018-01-15 ENCOUNTER — Encounter: Payer: Self-pay | Admitting: *Deleted

## 2018-01-15 ENCOUNTER — Other Ambulatory Visit: Payer: Self-pay | Admitting: *Deleted

## 2018-01-15 NOTE — Patient Outreach (Signed)
[facility]) Care Management   01/15/2018  Samantha LlanosMyrtle J Terra 07-23-1935 [id number]  Samantha LlanosMyrtle J Batalla is an 83 y.o. female  Subjective:  Pt reports lives with daughter, wants to move due to no  Side rails on back or front stairs,risk for fall.    Daughter also reports having mold in bathroom, someone came out to assess it last week  but have not heard back yet.   Pt reports she is doing good with her  Heart disease,weighs daily, today- 272 lbs, sob if talk too long, uses oxygen at night 2 liters [place].  Pt reports having pressure in chest this  Am, only lasted a second.  Pt reports needs to get surgery on left  knee,already lost 100 lbs, need to loose 30 lbs more.      Objective:   Vitals:   01/15/18 1155  BP: (!) 102/50  Pulse: (!) 54  Resp: (!) 24  SpO2: 98%    ROS  Physical Exam  Constitutional: She is oriented to person, place, and time. She appears well-developed and well-nourished.  Cardiovascular: Regular rhythm and normal heart sounds.  HR 54, no complaints of dizziness.   Respiratory: Effort normal and breath sounds normal.  GI: Soft.  Musculoskeletal: Normal range of motion. She exhibits edema.  Small amount of edema both ankles.   Neurological: She is alert and oriented to person, place, and time.  Skin: Skin is warm and dry.  Small cyst on right lower leg/under skin.   Psychiatric: She has a normal mood and affect. Her behavior is normal. Judgment and thought content normal.    Encounter Medications:   Outpatient Encounter Medications as of 01/15/2018  Medication Sig  . aspirin 81 MG tablet aspirin 81 mg tablet,delayed release  take 1 tablet by mouth once daily  . atenolol (TENORMIN) 25 MG tablet Take 1 tablet (25 mg total) by mouth daily.  Marland Kitchen. atorvastatin (LIPITOR) 20 MG tablet Take 1 tablet (20 mg total) by mouth daily.  . Blood Pressure Monitoring (BLOOD PRESSURE CUFF) MISC 1 Device by Does not apply route daily.  . clopidogrel (PLAVIX) 75  MG tablet Take 1 tablet (75 mg total) by mouth daily.  Marland Kitchen. dexlansoprazole (DEXILANT) 60 MG capsule Take 1 capsule (60 mg total) by mouth daily.  Marland Kitchen. diltiazem (TIAZAC) 360 MG 24 hr capsule Take 1 capsule (360 mg total) by mouth daily.  Marland Kitchen. lidocaine (XYLOCAINE) 5 % ointment Apply 1 application topically as needed.  Marland Kitchen. losartan (COZAAR) 25 MG tablet Take 1 tablet (25 mg total) by mouth daily.  Marland Kitchen. torsemide (DEMADEX) 100 MG tablet Take 1 tablet (100 mg total) by mouth daily.   No facility-administered encounter medications on file as of 01/15/2018.     Functional Status:   In your present state of health, do you have any difficulty performing the following activities: 01/15/2018 12/30/2017  Hearing? N Y  Vision? N N  Difficulty concentrating or making decisions? N N  Walking or climbing stairs? Y Y  Comment - avoid stairs   Dressing or bathing? N N  Doing errands, shopping? Y N  Comment granddaughter helps witrh errands and takes patient to the doctor -  Quarry managerreparing Food and eating ? N N  Using the Toilet? N N  Comment uses a commode chair in her room -  In the past six months, have you accidently leaked urine? Malvin JohnsY Y  Comment wears depends depends  Do you have problems with loss of bowel control? N N  Managing your Medications? N N  Managing your Finances? [name]  Comment daughter [name] helps with finances and paying billls daughter does  Housekeeping or managing your Housekeeping? [name]  Comment daughter [name] helps with housekeeping -  Some recent data might be hidden    Fall/Depression Screening:    Fall Risk  01/15/2018 12/30/2017 09/16/2017  Falls in the past year? Yes Yes No  Number falls in past yr: 2 or more 2 or more -  Injury with Fall? No No -  Risk Factor Category  High Fall Risk High Fall Risk -  Risk for fall due to : History of fall(s);Impaired balance/gait History of fall(s);Impaired balance/gait -  Follow up Education provided Falls evaluation completed;Falls prevention discussed -    PHQ 2/9 Scores 01/15/2018 01/05/2018 12/30/2017 09/16/2017  PHQ - 2 Score 0 0 0 0    Assessment:  Pleasant 83 year old female, resides with daughter.  RN CM  Following up on referral from [facility] telephonic RN CM/original referral source PCP for home safety evaluation.   Co visit done with Chrystal [facility] LCSW.   Daughter present during visit.  Pt's history includes but not limited to GERD, Osteoarthritis, Morbid obesity, CAD, Hypoxia, Hypertension. Home safety evaluation: front door to apartment building has one stair, no    Rails, back door 2 stairs no rails- fall risk.  Pt ambulates well with walker.  CAD:  Per pt reported pressure in chest/lasted a second.  Reviewed with     Pt signs/symptoms of MI and stroke, when to call MD.  Medications: medication review completed with pt, showed pt taking     Simvastatin instead of Atorvastatin (ordered 12/30/17 PCP visit).  RN CM     Called PCP office, spoke with PCP who confirmed pt to be on      Atorvastatin.   Pt's pharmacy called - was informed Atorvastatin available      For pickup, relayed to pt/daughter to pick up.       Plan: As discussed with pt, to continue to provide Community CM services,     Follow up again next month- home visit.     Plan to send Barrier letter to PCP informing of [facility] involvement as        Well as 01/15/18 home visit encounter.   [facility] CM Care Plan Problem One     Most Recent Value  Care Plan Problem One  hx of CAD  Role Documenting the Problem One  Care Management Coordinator  Care Plan for Problem One  Active  [facility] Long Term Goal   Pt knowledge of CAD will improve in the next 62 days   [facility] Long Term Goal Start Date  01/15/18  Interventions for Problem One Long Term Goal  Discussed with pt signs/symptoms of  MI,when to call MD   [facility] CM Short Term Goal #1   Pt will take all medications as ordered for the next 30 days   [facility] CM Short Term Goal #1 Start Date  01/15/18  Interventions for Short Term Goal #1  Medication review  completed with pt, pt taking Simvastatin,not Lipitor to which PCP called- verified to be on Lipitor.   [facility] CM Short Term Goal #2   Pt would continue with Heart Healthy diet for the next 30 days   [facility] CM Short Term Goal #2 Start Date  01/15/18  Interventions for Short Term Goal #2  Reinforced with pt ongoing adherence with Heart healthy diet, limitiing salt.  [name].   [facility] Care Management  [phone]

## 2018-01-15 NOTE — Patient Outreach (Signed)
[facility]) Care Management  [facility] Social Work  01/15/2018  Samantha Cooper 1935/07/07 [id number]  Subjective:  Patient is a 83 year female referred for assistance with her Advanced Directive and resources for housing. Per patient, she is receiving section 8 . [employer] manages the apartment complex. Patient  lives with her daughter Samantha Cooper but may be moving out soon to live with either her daughter or her other daughter. It was confirmed that patient has stairs going into her apartment both front and back door that have no railing. This Child psychotherapist, spoke with Harriett Sine in maintenance at [employer] who states that they will have maintenance  come out within a few days to put in a rail. Per patient's daughter there is a mold issue in the bathroom, maintenance came out and took pictures but no one has come out yet to address it.  This Child psychotherapist spoke with [employer] and requested that someone address this issue as well. Additional resources provided for affordable housing, however suggested that patient discussed moving options with her case manger through Section 8. Patient discussed need for a in home aid, personal care services through [facility] care reviewed.   Objective:   Encounter Medications:  Outpatient Encounter Medications as of 01/15/2018  Medication Sig  . aspirin 81 MG tablet aspirin 81 mg tablet,delayed release  take 1 tablet by mouth once daily  . atenolol (TENORMIN) 25 MG tablet Take 1 tablet (25 mg total) by mouth daily.  Marland Kitchen atorvastatin (LIPITOR) 20 MG tablet Take 1 tablet (20 mg total) by mouth daily.  . Blood Pressure Monitoring (BLOOD PRESSURE CUFF) MISC 1 Device by Does not apply route daily.  . clopidogrel (PLAVIX) 75 MG tablet Take 1 tablet (75 mg total) by mouth daily.  Marland Kitchen dexlansoprazole (DEXILANT) 60 MG capsule Take 1 capsule (60 mg total) by mouth daily.  Marland Kitchen diltiazem (TIAZAC) 360 MG 24 hr capsule Take 1 capsule (360 mg  total) by mouth daily.  Marland Kitchen lidocaine (XYLOCAINE) 5 % ointment Apply 1 application topically as needed.  Marland Kitchen losartan (COZAAR) 25 MG tablet Take 1 tablet (25 mg total) by mouth daily.  Marland Kitchen torsemide (DEMADEX) 100 MG tablet Take 1 tablet (100 mg total) by mouth daily.   No facility-administered encounter medications on file as of 01/15/2018.     Functional Status:  In your present state of health, do you have any difficulty performing the following activities: 01/15/2018 12/30/2017  Hearing? N Y  Vision? N N  Difficulty concentrating or making decisions? N N  Walking or climbing stairs? Y Y  Comment - avoid stairs   Dressing or bathing? N N  Doing errands, shopping? Y N  Comment granddaughter helps witrh errands and takes patient to the doctor -  Quarry manager and eating ? N N  Using the Toilet? N N  Comment uses a commode chair in her room -  In the past six months, have you accidently leaked urine? Samantha Cooper  Comment wears depends depends  Do you have problems with loss of bowel control? N N  Managing your Medications? N N  Managing your Finances? Samantha Cooper  Comment daughter Samantha Cooper helps with finances and paying billls daughter does  Housekeeping or managing your Housekeeping? Samantha Cooper  Comment daughter Samantha Cooper helps with housekeeping -  Some recent data might be hidden    Fall/Depression Screening:  PHQ 2/9 Scores 01/15/2018 01/05/2018 12/30/2017 09/16/2017  PHQ - 2 Score 0 0 0 0  Assessment:  Patient friendly and engaging. Advanced Directive completed during visit. Patient encouraged to have the document notarized and copies provided to her primary care doctor. Patient has unsteady gait, walks with a walker and reports that she could benefit from a in home aid. Safety concerns- with no railing on stairs leading up to her apartment, front and back door-realty company called and issue reported. Patient requesting in home aid to assist with ADL's  Plan: Social worker will send patient's provider a request  form for a independent assessment for personal care services to be completed by her provider.   Adriana ReamsChrystal Muhamad Serano, LCSW [facility] [phone]

## 2018-01-16 ENCOUNTER — Encounter: Payer: Self-pay | Admitting: *Deleted

## 2018-01-22 ENCOUNTER — Other Ambulatory Visit: Payer: Self-pay | Admitting: *Deleted

## 2018-01-22 NOTE — Patient Outreach (Signed)
[facility]) Care Management  01/22/2018  Samantha LlanosMyrtle J Cooper 1935/08/05 [id number]   Phone call from  patient stating that someone from her realty company did come by to check on the mold issue in her bathroom but there has been no one to come out to put a rail. Patient requesting follow up with this as she has called and received no answers. This Child psychotherapistsocial worker informed patient that the request form for personal care services has been faxed to her providers office for completion.  Plan: This Child psychotherapistsocial worker will contact the realtors office to discuss railing concerns.   Adriana ReamsChrystal Akaash Vandewater, LCSW [facility] Care Management [phone]

## 2018-01-27 ENCOUNTER — Other Ambulatory Visit: Payer: Self-pay | Admitting: *Deleted

## 2018-01-27 NOTE — Patient Outreach (Signed)
[facility]) Care Management  01/27/2018  Janyce LlanosMyrtle J Beshears 1934-12-14 [id number]   [facility] [employer], spoke with Harriett SineNancy for an update on the installation of a railing for her front and back stairs and the mildew in the bathroom. Per Harriett SineNancy, the railing hs been reported twice. She is not sure why it has not been done and will call maintenance again to follow up. Per Harriett SineNancy, maintenance has been put to inspect the mold. There suggestion ia to keep the area wiped down as much as possible due to minimal ventilation in that area.   Plan: This Child psychotherapistsocial worker will discuss the above with patient.   Adriana ReamsChrystal Land, LCSW [facility] Care Management [phone]

## 2018-01-28 ENCOUNTER — Telehealth: Payer: Self-pay | Admitting: Nurse Practitioner

## 2018-01-28 NOTE — Telephone Encounter (Signed)
Samantha Cooper with [facility] received a fax for pt about personal care services but it was missing the signature page.  Please refax.  Her call back number is [phone]

## 2018-01-30 ENCOUNTER — Other Ambulatory Visit: Payer: Self-pay | Admitting: *Deleted

## 2018-01-30 NOTE — Patient Outreach (Signed)
[facility]) Care Management  01/30/2018  Janyce LlanosMyrtle J Trapani [birth year] [id number]   Phone call to patient to follow up on railing and personal care services. Per patient, her landlord has not made arrangements to install the railing yet. Per patient, she has contacted the realty company and they told her to be patient as they are behind on calls. Patient states that she has spoken to her case manager through the housing authority and she may have found a house for her to move to.  This Child psychotherapistsocial worker confirmed receipt of the documentation to receive an independent assessment for personal care services from her providers office and will forward to [facility]. Patient informed that she will receive a call from [facility] to schedule an assessment.   Plan: This social worker will follow up with patient within 2 weeks regarding personal care services.    Adriana ReamsChrystal Harlee Pursifull, LCSW [facility] Care Management [phone]

## 2018-02-12 ENCOUNTER — Other Ambulatory Visit: Payer: Self-pay | Admitting: *Deleted

## 2018-02-12 NOTE — Patient Outreach (Addendum)
[facility]) Care Management  02/12/2018  Samantha Cooper 21-Aug-1935 [id number]      Phone call to patient to follow up on independent assessment for personal care services. Per patient, she has an appointment for a Independent assessment for personal care services on 02/18/18. Per patient, she had an opportunity to move into a house, however it was too small. Patient decided to continue to look on her own. Patient further states that maintenance has not fixed the railing on the stairs leading up to the front and back door of her apartment, despite multiple calls to the management company.   Plan: This social worker encouraged patient to continue to contact the management company regarding installing the rail for her stairs. This social worker will follow up with patient regarding the results of her independent assessment on 02/18/18 within 2 weeks.    Adriana ReamsChrystal Land, LCSW [facility] Care Management [phone]

## 2018-02-16 ENCOUNTER — Other Ambulatory Visit: Payer: Self-pay | Admitting: *Deleted

## 2018-02-16 ENCOUNTER — Telehealth: Payer: Self-pay

## 2018-02-16 DIAGNOSIS — I10 Essential (primary) hypertension: Secondary | ICD-10-CM

## 2018-02-16 MED ORDER — DILTIAZEM HCL ER BEADS 300 MG PO CP24: 300.0000 mg | ORAL_CAPSULE | Freq: Every day | ORAL | 5 refills | Status: DC

## 2018-02-16 NOTE — Telephone Encounter (Signed)
The pt was notified. No questions or concerns. 

## 2018-02-16 NOTE — Patient Outreach (Signed)
[facility]) Care Management   02/16/2018  Samantha Cooper 12/12/1934 [id number]  Samantha Cooper is an 83 y.o. female  Subjective:   Pt reports being sob at times on exertion, no chest pain, Compliant with taking all of her medications.  Pt reports weighs daily, recording, loosing weight, been eating less- weight  Today 267.2 lbs.   Pt reports still waiting on landlord to fix rails, mold-  Chrystal [facility] LCSW to follow up.  Pt reports has not been checking her  B/P - machine broke, threw it out, cannot afford a new one.    Objective:   Vitals:   02/16/18 1031 02/16/18 1114  BP: 90/64 96/60  Pulse: (!) 55 (!) 50  Resp: (!) 28   SpO2: 95% 97%    ROS  Physical Exam  Constitutional: She is oriented to person, place, and time. She appears well-developed and well-nourished.  Cardiovascular: Regular rhythm and normal heart sounds.  HR 50,55  Respiratory: Effort normal and breath sounds normal.  Musculoskeletal: Normal range of motion. She exhibits edema.  Trace edema bilateral ankles   Neurological: She is alert and oriented to person, place, and time.  Skin: Skin is warm and dry.  Psychiatric: She has a normal mood and affect. Her behavior is normal. Judgment and thought content normal.    Encounter Medications:   Outpatient Encounter Medications as of 02/16/2018  Medication Sig Note  . aspirin 81 MG tablet aspirin 81 mg tablet,delayed release  take 1 tablet by mouth once daily   . atenolol (TENORMIN) 25 MG tablet Take 1 tablet (25 mg total) by mouth daily.   Marland Kitchen atorvastatin (LIPITOR) 20 MG tablet Take 1 tablet (20 mg total) by mouth daily. (Patient not taking: Reported on 01/15/2018) 01/15/2018: Pt to have family pick up from pharmacy/start taking   . Blood Pressure Monitoring (BLOOD PRESSURE CUFF) MISC 1 Device by Does not apply route daily. (Patient not taking: Reported on 01/15/2018)   . clopidogrel (PLAVIX) 75 MG tablet Take 1 tablet (75 mg total) by mouth  daily.   Marland Kitchen dexlansoprazole (DEXILANT) 60 MG capsule Take 1 capsule (60 mg total) by mouth daily.   Marland Kitchen diltiazem (TIAZAC) 360 MG 24 hr capsule Take 1 capsule (360 mg total) by mouth daily.   Marland Kitchen lidocaine (XYLOCAINE) 5 % ointment Apply 1 application topically as needed.   Marland Kitchen losartan (COZAAR) 25 MG tablet Take 1 tablet (25 mg total) by mouth daily.   Marland Kitchen torsemide (DEMADEX) 100 MG tablet Take 1 tablet (100 mg total) by mouth daily.    No facility-administered encounter medications on file as of 02/16/2018.     Functional Status:   In your present state of health, do you have any difficulty performing the following activities: 01/15/2018 12/30/2017  Hearing? N Y  Vision? N N  Difficulty concentrating or making decisions? N N  Walking or climbing stairs? Y Y  Comment - avoid stairs   Dressing or bathing? N N  Doing errands, shopping? Y N  Comment granddaughter helps witrh errands and takes patient to the doctor -  Conservation officer, nature and eating ? N N  Using the Toilet? N N  Comment uses a commode chair in her room -  In the past six months, have you accidently leaked urine? [name]  Comment wears depends depends  Do you have problems with loss of bowel control? N N  Managing your Medications? N N  Managing your Finances? [name]  Comment daughter [name]  helps with finances and paying billls daughter does  Housekeeping or managing your Housekeeping? Y N  Comment daughter kathy helps with housekeeping -  Some recent data might be hidden    Fall/Depression Screening:    Fall Risk  01/15/2018 12/30/2017 09/16/2017  Falls in the past year? Yes Yes No  Number falls in past yr: 2 or more 2 or more -  Injury with Fall? No No -  Risk Factor Category  High Fall Risk High Fall Risk -  Risk for fall due to : History of fall(s);Impaired balance/gait History of fall(s);Impaired balance/gait -  Follow up Education provided Falls evaluation completed;Falls prevention discussed -   PHQ 2/9 Scores 01/15/2018 01/05/2018  12/30/2017 09/16/2017  PHQ - 2 Score 0 0 0 0    Assessment:  Pleasant 82 year old female, resides with daughter.  Pt's  History includes but not limited to Hypertension, Osteoarthritis, GERD, Hyperlipidemia, Hypoxia, CAD,  CAD:  HR this am 50,55.  Pt asymptomatic- no complaints of dizziness     At rest or when standing up.   AM medications taken 4.5 -5 hours ago.     Per pt drinking 3-4 bottles of water a day.  Low BP:  BP today 90/64 RA, had pt drink approximately 10 oz of water     During visit, recheck 96/60 RA.   AM medications taken.  No dizziness     Noted or reported sitting, changing positions to standing.   Plan: RN CM called PCP office, spoke with Kelita PCP's CMA- reported     Pt's low BP and HR.  Kelita to report to Lauren Kennedy NP and then     Follow up with pt.            As discussed with pt, plan to follow up again this week- provide     BP machine.             As discussed with pt, to continue to provide Community CM    Services, follow up again next month- home visit.    [facility] CM Care Plan Problem One     Most Recent Value  Care Plan Problem One  Hx of CAD   Role Documenting the Problem One  Care Management Coordinator  Care Plan for Problem One  Active  [facility]N Long Term Goal   Pt's knowledge would improve in the next 62 days   [facility]N Long Term Goal Start Date  01/15/18  [facility] CM Short Term Goal #1   Pt will take all medications as ordered for the next 30 days   [facility] CM Short Term Goal #1 Start Date  01/15/18  [facility] CM Short Term Goal #1 Met Date  02/16/18  Interventions for Short Term Goal #1  Medications reviewed with pt.   [facility] CM Short Term Goal #2   Pt would continue with Heart Healthy diet for the next 30 days   [facility] CM Short Term Goal #2 Start Date  02/16/18  [facility] CM Short Term Goal #2 Met Date  02/16/18  Interventions for Short Term Goal #2  Discussed with pt ongoing adherence, avoid skipping breakfast   [facility] CM Short Term Goal #3  Pt's HR and BP would be within norm  within the next 7 days   [facility] CM Short Term Goal #3 Start Date  02/16/18  Interventions for Short Tern Goal #3  PCP office called - low HR,BP reported, discussed with pt importance of ongoing hydration, s/s to    911.       Rose M.   Pierzchala RN CCM [facility] Care Management  [phone]     

## 2018-02-16 NOTE — Telephone Encounter (Signed)
[facility] with concerns about the pt blood pressure and pulse. Her bp reading was 90/64 pulse 55, 96/60 pulse 50. She is concern because the pt last heart rate in our office was 130/64 with a heart rate of 78. She states the pt is asymptomatic. I informed her to tell the patient to stay very hydrated and if she develop symptoms of lightheadedness, dizziness, or syncope to seek medical care. I also informed Rosa that we will follow up with the patient today.

## 2018-02-16 NOTE — Telephone Encounter (Signed)
Pt should reduce diltiazem from 360 mg to 300 mg daily.  [facility] [address]

## 2018-02-23 ENCOUNTER — Other Ambulatory Visit: Payer: Self-pay | Admitting: *Deleted

## 2018-02-23 NOTE — Patient Outreach (Signed)
[facility]) Care Management  02/23/2018  [name] 07/17/1935 [id number]   Successful telephone encounter to Samantha Cooper, 83 year old female follow up on BP and HR readings as this RN CM provided pt with a BP machine on 02/20/18.   Spoke with pt, HIPAA identifiers provided.   Pt reports started checking her BP  On 02/21/18, results provided by pt:  4/27- BP 158/82  HR 65, 4/28- 111/52 HR 48,  Today 161/87 HR 66.   RN CM discussed with pt to continue to check daily, record, Bring results to next PCP office visit to which pt agreed.  Also discussed  With pt  calling PCP for ongoing  low BP and HR readings to which pt voiced understanding.  Pt voiced no issues on use of BP machine.      Plan:  As discussed with pt, plan to follow up again next month- home visit.   Shayne Alken.   Pierzchala RN CCM [facility] Care Management  [phone]

## 2018-02-26 ENCOUNTER — Other Ambulatory Visit: Payer: Self-pay | Admitting: *Deleted

## 2018-02-26 NOTE — Patient Outreach (Signed)
[facility]) Care Management  02/26/2018  Samantha Cooper 26-Dec-1934 [id number]    Phone call to patient to follow up on in home assessment for personal care services. Per patient, she has been approved for 2 days a week and will be receiving services through [facility]. In home care to start on Tuesday, 03/03/18.  Per patient , her apartment management company has not replaced the railing yet. She  states that she along with her daughter have called them and have been told that they have the work order and the repair is in process.  Patient verbalized having no additional social work needs at this time. Patient to be closed to social work. RNCM and patient's provider to be notified of case closure.  Adriana Reams [facility] Care Management [phone]

## 2018-03-16 ENCOUNTER — Telehealth: Payer: Self-pay | Admitting: Nurse Practitioner

## 2018-03-19 ENCOUNTER — Other Ambulatory Visit: Payer: Self-pay | Admitting: *Deleted

## 2018-03-19 NOTE — Patient Outreach (Addendum)
[facility]) Care Management   03/19/2018  Samantha Cooper 1935/03/07 [id number]  Samantha Cooper is an 83 y.o. female  Subjective:  Pt reports [facility] services started, aide coming two times  A week, helps with bath if needed, laundry.   Pt reports continues to check her  Weight, BP, HR daily- recording, need to continue on loosing weight to be  Eligible for knee surgery.   Pt reports BP today was 145/84 before taking her  Medications.   Objective:   Vitals:   03/19/18 1108  BP: 122/68  Pulse: 69  Resp: 16  SpO2: 95%      ROS  Physical Exam  Constitutional: She is oriented to person, place, and time. She appears well-developed and well-nourished.  Cardiovascular: Normal rate, regular rhythm and normal heart sounds.  Respiratory: Effort normal and breath sounds normal.  GI: Soft.  Musculoskeletal: Normal range of motion. She exhibits no edema.  Neurological: She is alert and oriented to person, place, and time.  Skin: Skin is warm and dry.  Psychiatric: She has a normal mood and affect. Her behavior is normal. Judgment and thought content normal.    Encounter Medications:   Outpatient Encounter Medications as of 03/19/2018  Medication Sig Note  . aspirin 81 MG tablet aspirin 81 mg tablet,delayed release  take 1 tablet by mouth once daily   . atenolol (TENORMIN) 25 MG tablet Take 1 tablet (25 mg total) by mouth daily.   Marland Kitchen atorvastatin (LIPITOR) 20 MG tablet Take 1 tablet (20 mg total) by mouth daily. 01/15/2018: Pt to have family pick up from pharmacy/start taking   . Blood Pressure Monitoring (BLOOD PRESSURE CUFF) MISC 1 Device by Does not apply route daily.   . clopidogrel (PLAVIX) 75 MG tablet Take 1 tablet (75 mg total) by mouth daily.   Marland Kitchen dexlansoprazole (DEXILANT) 60 MG capsule Take 1 capsule (60 mg total) by mouth daily.   Marland Kitchen diltiazem (TIAZAC) 300 MG 24 hr capsule Take 1 capsule (300 mg total) by mouth daily.   Marland Kitchen lidocaine (XYLOCAINE)  5 % ointment Apply 1 application topically as needed.   Marland Kitchen losartan (COZAAR) 25 MG tablet Take 1 tablet (25 mg total) by mouth daily.   Marland Kitchen torsemide (DEMADEX) 100 MG tablet Take 1 tablet (100 mg total) by mouth daily.    No facility-administered encounter medications on file as of 03/19/2018.     Functional Status:   In your present state of health, do you have any difficulty performing the following activities: 01/15/2018 12/30/2017  Hearing? N Y  Vision? N N  Difficulty concentrating or making decisions? N N  Walking or climbing stairs? Y Y  Comment - avoid stairs   Dressing or bathing? N N  Doing errands, shopping? Y N  Comment granddaughter helps witrh errands and takes patient to the doctor -  Conservation officer, nature and eating ? N N  Using the Toilet? N N  Comment uses a commode chair in her room -  In the past six months, have you accidently leaked urine? [name]  Comment wears depends depends  Do you have problems with loss of bowel control? N N  Managing your Medications? N N  Managing your Finances? [name]  Comment daughter [name] helps with finances and paying billls daughter does  Housekeeping or managing your Housekeeping? [name]  Comment daughter [name] helps with housekeeping -  Some recent data might be hidden    Fall/Depression Screening:  Fall Risk  01/15/2018 12/30/2017 09/16/2017  Falls in the past year? Yes Yes No  Number falls in past yr: 2 or more 2 or more -  Injury with Fall? No No -  Risk Factor Category  High Fall Risk High Fall Risk -  Risk for fall due to : History of fall(s);Impaired balance/gait History of fall(s);Impaired balance/gait -  Follow up Education provided Falls evaluation completed;Falls prevention discussed -   PHQ 2/9 Scores 01/15/2018 01/05/2018 12/30/2017 09/16/2017  PHQ - 2 Score 0 0 0 0    Assessment:  Pleasant 83 year old female, daughter lives with her/grandson also  Available if needed.   Pt's history includes by not limited to Hypertension,  Osteoarthritis, Hyperlipidemia, GERD, CAD.  Weights: review of pt's weights range 262.2 to 267.4 lbs, 265.3 today.    Hypertension:  Review of pt's readings 145/84 today, ranges for month of May 144/83    To 171/86.  HR today 65, ranges 60-73. Discussed with pt plan to discharge from [facility] CM services, goals met to which     Pt agreed.      Plan:  As discussed with pt, plan to discharge from [facility] CM services,      Goals met.              Plan to inform [name] NP of discharge, send by in basket  Case     Closure letter.   [facility] CM Care Plan Problem One     Most Recent Value  Care Plan Problem One  Hx of CAD   Role Documenting the Problem One  Care Management Coordinator  Care Plan for Problem One  Active  [facility] Long Term Goal   Pt's knowledge (CAD) would improve in the next 17 days   [facility] Long Term Goal Start Date  03/17/18  [facility] Long Term Goal Met Date  03/19/18  Interventions for Problem One Long Term Goal  Reviewed with pt weights, BP, HR readings- [facility] calendar provided for further recordings.    [facility] CM Short Term Goal #1   Patient will confirm a scheduled  appointment for a individual assessment for personal care services within 30 days  [facility] CM Short Term Goal #1 Start Date  01/15/18  [facility] CM Short Term Goal #1 Met Date  02/12/18  [facility] CM Short Term Goal #3  Pt's HR and BP would be within norm   [facility] CM Short Term Goal #3 Start Date  02/16/18     [name].   [facility] Care Management  [phone]

## 2018-03-26 ENCOUNTER — Other Ambulatory Visit: Payer: Self-pay | Admitting: Nurse Practitioner

## 2018-03-26 DIAGNOSIS — K219 Gastro-esophageal reflux disease without esophagitis: Secondary | ICD-10-CM

## 2018-03-26 DIAGNOSIS — I10 Essential (primary) hypertension: Secondary | ICD-10-CM

## 2018-03-26 DIAGNOSIS — I2510 Atherosclerotic heart disease of native coronary artery without angina pectoris: Secondary | ICD-10-CM

## 2018-05-07 DIAGNOSIS — J449 Chronic obstructive pulmonary disease, unspecified: Secondary | ICD-10-CM | POA: Diagnosis not present

## 2018-06-07 DIAGNOSIS — J449 Chronic obstructive pulmonary disease, unspecified: Secondary | ICD-10-CM | POA: Diagnosis not present

## 2018-06-08 ENCOUNTER — Ambulatory Visit (INDEPENDENT_AMBULATORY_CARE_PROVIDER_SITE_OTHER): Payer: Medicare HMO | Admitting: Nurse Practitioner

## 2018-06-08 ENCOUNTER — Encounter: Payer: Self-pay | Admitting: Nurse Practitioner

## 2018-06-08 ENCOUNTER — Other Ambulatory Visit: Payer: Self-pay

## 2018-06-08 VITALS — BP 126/49 | HR 50 | Temp 98.2°F | Ht 65.0 in | Wt 264.0 lb

## 2018-06-08 DIAGNOSIS — I10 Essential (primary) hypertension: Secondary | ICD-10-CM

## 2018-06-08 DIAGNOSIS — R001 Bradycardia, unspecified: Secondary | ICD-10-CM | POA: Diagnosis not present

## 2018-06-08 DIAGNOSIS — T50905A Adverse effect of unspecified drugs, medicaments and biological substances, initial encounter: Secondary | ICD-10-CM

## 2018-06-08 DIAGNOSIS — B029 Zoster without complications: Secondary | ICD-10-CM

## 2018-06-08 MED ORDER — GABAPENTIN 100 MG PO CAPS: 100.0000 mg | ORAL_CAPSULE | Freq: Three times a day (TID) | ORAL | 0 refills | Status: DC

## 2018-06-08 MED ORDER — VALACYCLOVIR HCL 1 G PO TABS: 1000.0000 mg | ORAL_TABLET | Freq: Three times a day (TID) | ORAL | 0 refills | Status: DC

## 2018-06-08 MED ORDER — AMLODIPINE BESYLATE 10 MG PO TABS: 10.0000 mg | ORAL_TABLET | Freq: Every day | ORAL | 1 refills | Status: DC

## 2018-06-08 MED ORDER — VALACYCLOVIR HCL 1 G PO TABS: 1000.0000 mg | ORAL_TABLET | Freq: Three times a day (TID) | ORAL | 0 refills | Status: AC

## 2018-06-08 NOTE — Patient Instructions (Addendum)
Samantha LlanosMyrtle J Chieffo,   Thank you for coming in to clinic today.  1. You have shingles today. - START valacyclovir 1000 mg three times daily ( about every 8 hours) for 7 days  - START gabapentin 100 mg three times daily as needed for pain with your shingles.  Take only at bedtime if it makes you sleepy.  2.  Blood pressure: - STOP diltiazem.  Your heart rate is too slow to continue this medication. - START amlodipine 10 mg once daily.    Please schedule a follow-up appointment with Wilhelmina McardleLauren Jenisse Vullo, AGNP. Return if symptoms worsen or fail to improve AND as scheduled on 07/02/2018.  If you have any other questions or concerns, please feel free to call the clinic or send a message through MyChart. You may also schedule an earlier appointment if necessary.  You will receive a survey after today's visit either digitally by e-mail or paper by [employer] mail. Your experiences and feedback matter to us.  Please respond so we know how we are doing as we provide care for you.   Wilhelmina McardleLauren Dabid Godown, DNP, AGNP-BC Adult Gerontology Nurse Practitioner [facility], [facility]   Shingles Shingles, which is also known as herpes zoster, is an infection that causes a painful skin rash and fluid-filled blisters. Shingles is not related to genital herpes, which is a sexually transmitted infection. Shingles only develops in people who:  Have had chickenpox.  Have received the chickenpox vaccine. (This is rare.)  What are the causes? Shingles is caused by varicella-zoster virus (VZV). This is the same virus that causes chickenpox. After exposure to VZV, the virus stays in the body in an inactive (dormant) state. Shingles develops if the virus reactivates. This can happen many years after the initial exposure to VZV. It is not known what causes this virus to reactivate. What increases the risk? People who have had chickenpox or received the chickenpox vaccine are at risk for shingles. Infection is more common  in people who:  Are older than age 81.  Have a weakened defense (immune) system, such as those with HIV, AIDS, or cancer.  Are taking medicines that weaken the immune system, such as transplant medicines.  Are under great stress.  What are the signs or symptoms? Early symptoms of this condition include itching, tingling, and pain in an area on your skin. Pain may be described as burning, stabbing, or throbbing. A few days or weeks after symptoms start, a painful red rash appears, usually on one side of the body in a bandlike or beltlike pattern. The rash eventually turns into fluid-filled blisters that break open, scab over, and dry up in about 2-3 weeks. At any time during the infection, you may also develop:  A fever.  Chills.  A headache.  An upset stomach.  How is this diagnosed? This condition is diagnosed with a skin exam. Sometimes, skin or fluid samples are taken from the blisters before a diagnosis is made. These samples are examined under a microscope or sent to a lab for testing. How is this treated? There is no specific cure for this condition. Your health care provider will probably prescribe medicines to help you manage pain, recover more quickly, and avoid long-term problems. Medicines may include:  Antiviral drugs.  Anti-inflammatory drugs.  Pain medicines.  If the area involved is on your face, you may be referred to a specialist, such as an eye doctor (ophthalmologist) or an ear, nose, and throat (ENT) doctor to help you  avoid eye problems, chronic pain, or disability. Follow these instructions at home: Medicines  Take medicines only as directed by your health care provider.  Apply an anti-itch or numbing cream to the affected area as directed by your health care provider. Blister and Rash Care  Take a cool bath or apply cool compresses to the area of the rash or blisters as directed by your health care provider. This may help with pain and  itching.  Keep your rash covered with a loose bandage (dressing). Wear loose-fitting clothing to help ease the pain of material rubbing against the rash.  Keep your rash and blisters clean with mild soap and cool water or as directed by your health care provider.  Check your rash every day for signs of infection. These include redness, swelling, and pain that lasts or increases.  Do not pick your blisters.  Do not scratch your rash. General instructions  Rest as directed by your health care provider.  Keep all follow-up visits as directed by your health care provider. This is important.  Until your blisters scab over, your infection can cause chickenpox in people who have never had it or been vaccinated against it. To prevent this from happening, avoid contact with other people, especially: ? Babies. ? Pregnant women. ? Children who have eczema. ? Elderly people who have transplants. ? People who have chronic illnesses, such as leukemia or AIDS. Contact a health care provider if:  Your pain is not relieved with prescribed medicines.  Your pain does not get better after the rash heals.  Your rash looks infected. Signs of infection include redness, swelling, and pain that lasts or increases. Get help right away if:  The rash is on your face or nose.  You have facial pain, pain around your eye area, or loss of feeling on one side of your face.  You have ear pain or you have ringing in your ear.  You have loss of taste.  Your condition gets worse. This information is not intended to replace advice given to you by your health care provider. Make sure you discuss any questions you have with your health care provider. Document Released: 10/14/2005 Document Revised: 06/09/2016 Document Reviewed: 08/25/2014 Elsevier Interactive Patient Education  2018 [employer]

## 2018-06-08 NOTE — Progress Notes (Signed)
Subjective:    Patient ID: Samantha Cooper, female    DOB: Sep 10, 1935, 83 y.o.   MRN: [id number]  Samantha Cooper is a 83 y.o. female presenting on 06/08/2018 for Herpes Zoster (left lower back pain  x2 days. Stabbing pain in the back and painful to the touch. )   HPI Rash on Back Rash is painful and consistent with herpes zoster rash.  Patient reports rash and pain started 2 days ago.  Rash felt raised.  It is also associated with sharp and stabbing pain and with her gown sticking to her skin.  Rash and pain are located on left side of back, wrapping around to left flank and lower abdomen.  Social History   Tobacco Use  . Smoking status: Former Smoker    Packs/day: 1.50    Years: 10.00    Pack years: 15.00  . Smokeless tobacco: Former Neurosurgeon    Types: Snuff  . Tobacco comment: unsure when she quit   Substance Use Topics  . Alcohol use: No    Frequency: Never  . Drug use: No    Review of Systems  Respiratory: Negative for shortness of breath.   Cardiovascular: Negative for palpitations and leg swelling.  Neurological: Negative for dizziness, syncope and light-headedness.   Per HPI unless specifically indicated above     Objective:    BP (!) 126/49 (BP Location: Right Arm, Patient Position: Sitting, Cuff Size: Large)   Pulse (!) 50   Temp 98.2 F (36.8 C) (Oral)   Ht 5\' 5"  (1.651 m)   Wt 264 lb (119.7 kg)   BMI 43.93 kg/m   Wt Readings from Last 3 Encounters:  06/08/18 264 lb (119.7 kg)  03/19/18 265 lb 3.2 oz (120.3 kg)  02/16/18 267 lb 3.2 oz (121.2 kg)    Physical Exam  Constitutional: She is oriented to person, place, and time. She appears well-developed and well-nourished. No distress.  HENT:  Head: Normocephalic and atraumatic.  Cardiovascular: Regular rhythm, S1 normal, S2 normal and intact distal pulses. Bradycardia present. Exam reveals distant heart sounds.  Pulses:      Carotid pulses are 2+ on the right side, and 2+ on the left side.  Radial pulses are 2+ on the right side, and 2+ on the left side.       Posterior tibial pulses are 1+ on the right side, and 1+ on the left side.  Pulmonary/Chest: Effort normal and breath sounds normal. No respiratory distress.  Neurological: She is alert and oriented to person, place, and time.  Skin: Skin is warm and dry. Capillary refill takes less than 2 seconds. Rash (erythematous, painful, vesicular rash with herpetic lesions also noted along dermatomes T8-T9) noted.  Psychiatric: She has a normal mood and affect. Her behavior is normal.  Vitals reviewed.        Results for orders placed or performed in visit on 12/30/17  COMPLETE METABOLIC PANEL WITH GFR  Result Value Ref Range   Glucose, Bld 112 (H) 65 - 99 mg/dL   BUN 15 7 - 25 mg/dL   Creat 4.09 8.11 - 9.14 mg/dL   GFR, Est Non African American 71 > OR = 60 mL/min/1.18m2   GFR, Est African American 82 > OR = 60 mL/min/1.44m2   BUN/Creatinine Ratio NOT APPLICABLE 6 - 22 (calc)   Sodium 141 135 - 146 mmol/L   Potassium 3.8 3.5 - 5.3 mmol/L   Chloride 97 (L) 98 - 110 mmol/L   CO2 36 (  H) 20 - 32 mmol/L   Calcium 9.6 8.6 - 10.4 mg/dL   Total Protein 7.3 6.1 - 8.1 g/dL   Albumin 3.9 3.6 - 5.1 g/dL   Globulin 3.4 1.9 - 3.7 g/dL (calc)   AG Ratio 1.1 1.0 - 2.5 (calc)   Total Bilirubin 0.5 0.2 - 1.2 mg/dL   Alkaline phosphatase (APISO) 110 33 - 130 U/L   AST 24 10 - 35 U/L   ALT 15 6 - 29 U/L  Lipid panel  Result Value Ref Range   Cholesterol 205 (H) <200 mg/dL   HDL 57 >16>50 mg/dL   Triglycerides 109107 <604<150 mg/dL   LDL Cholesterol (Calc) 127 (H) mg/dL (calc)   Total CHOL/HDL Ratio 3.6 <5.0 (calc)   Non-HDL Cholesterol (Calc) 148 (H) <130 mg/dL (calc)  Hemoglobin V4UA1c  Result Value Ref Range   Hgb A1c MFr Bld 6.0 (H) <5.7 % of total Hgb   Mean Plasma Glucose 126 (calc)   eAG (mmol/L) 7.0 (calc)      Assessment & Plan:   Problem List Items Addressed This Visit      Cardiovascular and Mediastinum   Hypertension    Relevant Medications   amLODipine (NORVASC) 10 MG tablet    Other Visit Diagnoses    Herpes zoster without complication    -  Primary   Relevant Medications   valACYclovir (VALTREX) 1000 MG tablet   gabapentin (NEURONTIN) 100 MG capsule   Bradycardia, drug induced          # Herpes Zoster Acute herpes zoster infection of dermatomes T8-T9 onset 2 days ago with history of chicken pox as child.  Patient has  not received Shingrix vaccine.  Patient has mild neuropathic pain. - No evidence of complications, such as ocular involvement  Plan: 1. Start Valacyclovir 1000mg  TID with food x 7 days, counseled on treatment course and side effects. 2. Start gabapentin 100 mg TID prn neuropathic pain for next 30 days.  Cautioned drowsiness.  Patient verbalizes understanding. 3. Counseling on infectious nature of problem with avoiding close contact with high risk populations. 4. Follow-up as needed within 4-6 weeks if not resolving or if residual pain, strict return criteria if ocular involvement or other acute concerns.     # Hypertension with Bradycardia Likely drug induced bradycardia as patient is on diltiazem.  Reduced dose from 360-300 mg has not helped HR.  Patient is asymptomatic currently without dizziness or falls.  Plan: 1. STOP diltiazem. 2. START amlodipine 10 mg once daily. 3. Recheck at visit on 07/02/2018 as scheduled. 4. Followup sooner if needed.  Meds ordered this encounter  Medications  . valACYclovir (VALTREX) 1000 MG tablet    Sig: Take 1 tablet (1,000 mg total) by mouth 3 (three) times daily for 14 days.    Dispense:  21 tablet    Refill:  0    Order Specific Question:   Supervising Provider    Answer:   Smitty CordsKARAMALEGOS, ALEXANDER J [[id number]]  . gabapentin (NEURONTIN) 100 MG capsule    Sig: Take 1 capsule (100 mg total) by mouth 3 (three) times daily.    Dispense:  90 capsule    Refill:  0    Order Specific Question:   Supervising Provider    Answer:   Smitty CordsKARAMALEGOS,  ALEXANDER J [[id number]]  . amLODipine (NORVASC) 10 MG tablet    Sig: Take 1 tablet (10 mg total) by mouth daily.    Dispense:  90 tablet    Refill:  1  Order Specific Question:   Supervising Provider    Answer:   Smitty CordsKARAMALEGOS, ALEXANDER J [[id number]]    Follow up plan: Return if symptoms worsen or fail to improve AND as scheduled on 07/02/2018.  Wilhelmina McardleLauren Shadman Tozzi, DNP, AGPCNP-BC Adult Gerontology Primary Care Nurse Practitioner [facility] 06/08/2018, 2:38 PM

## 2018-06-23 NOTE — Telephone Encounter (Signed)
error 

## 2018-06-24 ENCOUNTER — Other Ambulatory Visit: Payer: Self-pay | Admitting: Nurse Practitioner

## 2018-06-24 DIAGNOSIS — I10 Essential (primary) hypertension: Secondary | ICD-10-CM

## 2018-07-02 ENCOUNTER — Encounter: Payer: Self-pay | Admitting: Nurse Practitioner

## 2018-07-02 ENCOUNTER — Other Ambulatory Visit: Payer: Self-pay

## 2018-07-02 ENCOUNTER — Ambulatory Visit (INDEPENDENT_AMBULATORY_CARE_PROVIDER_SITE_OTHER): Payer: Medicare HMO | Admitting: Nurse Practitioner

## 2018-07-02 VITALS — BP 121/53 | HR 72 | Temp 98.3°F | Ht 65.0 in | Wt 258.8 lb

## 2018-07-02 DIAGNOSIS — R0600 Dyspnea, unspecified: Secondary | ICD-10-CM

## 2018-07-02 DIAGNOSIS — Z9981 Dependence on supplemental oxygen: Secondary | ICD-10-CM | POA: Diagnosis not present

## 2018-07-02 DIAGNOSIS — I25119 Atherosclerotic heart disease of native coronary artery with unspecified angina pectoris: Secondary | ICD-10-CM

## 2018-07-02 DIAGNOSIS — E782 Mixed hyperlipidemia: Secondary | ICD-10-CM

## 2018-07-02 DIAGNOSIS — I10 Essential (primary) hypertension: Secondary | ICD-10-CM

## 2018-07-02 DIAGNOSIS — R0609 Other forms of dyspnea: Secondary | ICD-10-CM | POA: Diagnosis not present

## 2018-07-02 NOTE — Patient Instructions (Addendum)
Samantha Cooper,   Thank you for coming in to clinic today.  1. Make sure you stop your diltiazem.  2. Stop your gabapentin if you no longer need it for your shingles pain.  3. They will call you for your echocardiogram.    4. We will continue your home O2 for night time use. Stay on 2L when needed.   Please schedule a follow-up appointment with Wilhelmina Mcardle, AGNP. Return in about 3 months (around 10/01/2018) for hypertension.  If you have any other questions or concerns, please feel free to call the clinic or send a message through MyChart. You may also schedule an earlier appointment if necessary.  You will receive a survey after today's visit either digitally by e-mail or paper by [employer]. Your experiences and feedback matter to [place].  Please respond so we know how we are doing as we provide care for you.   Wilhelmina Mcardle, DNP, AGNP-BC Adult Gerontology Nurse Practitioner [facility], [facility]

## 2018-07-02 NOTE — Assessment & Plan Note (Signed)
Chronic condition with last labs in 12/2017 with stable lipids. Patient not currently taking any statin.   - Recheck lab with TSH today.  Defer lipid panel for 3 months.  Follow-up in 3 months.

## 2018-07-02 NOTE — Assessment & Plan Note (Signed)
Patient with coronary artery disease without angina who has been stable without issues for several years.  She no longer has connection with cardiology but had previously seen Dr. Juliann Pares.  Patient is taking Plavix and other medications to manage hypertension and possible CHF with presence of torsemide on her med list.  CHF diagnosis is unknown, but with increasing CHF symptoms with weight fluctuations/shortness of breath.  Plan: 1.  Continue Plavix until records can be reviewed.   2.  Continue hypertension management. 3. Evaluate possible CHF with echo.  EKG stable today. 4. Follow-up as needed and every year.

## 2018-07-02 NOTE — Progress Notes (Signed)
Subjective:    Patient ID: Samantha Cooper, female    DOB: [birth year], 83 y.o.   MRN: [id number]  Samantha Cooper is a 83 y.o. female presenting on 07/02/2018 for Hypertension and Obesity (weight management, pt state her weight fluctuates about 15-20lbs in a week. Pt reports she needs to lose 70lb to get knee replacement surgery.)   HPI Hypertension - She is checking BP at home or outside of clinic.  Readings 130-150/80s at home - Current medications: atenolol 25 mg once daily, amlodipine 10 mg once daily, torsemide 100 mg once daily and diltiazem - patient did not stop med as requested at last visit, tolerating well without side effects - She is not currently symptomatic, but endorses regular leg swelling, chest pressure at night relieved by O2. - Pt denies headache, lightheadedness, dizziness, changes in vision, palpitations, sudden loss of speech or loss of consciousness. - She  reports no regular exercise routine. - Her diet is moderate to high in salt, moderate in fat, and moderate in carbohydrates.   Patient admits adding salt when cooking, none at table.  Has meals out at restaurants regularly also.  Weight changes/Shortness of Breath Patient is morbidly obese and is trying to lose weight for knee replacement surgery.  Mentions weight today because of frustrations with weight fluctuations toward this goal. Patient reports rapid weight fluctuations of 15-20 lbs in 1 week.  Likely, is associated with sodium/fluid balance. Takes torsemide daily without missed doses.  Patient is short of breath with exertion - Uses O2 at home at night during sleep.  Patient awakens with chest discomfort, starts O2 and wears this the remainder on the night when she has pain in chest.  Occasionally hurts to breathe, but mostly feels chest pressure. Symptoms are chronic as patient has been on home O2 for many years.  Patient did not disclose this information to [place] at establish care visit. First notice of O2  use was when I received request for O2 renewal about 1 month ago. Patient has not had any past heart failure, pulmonary or cardiology visits.  No recent EKG.  No recent echocardiogram.  We cannot exclude heart failure.   Social History   Tobacco Use  . Smoking status: Former Smoker    Packs/day: 1.50    Years: 10.00    Pack years: 15.00  . Smokeless tobacco: Former Neurosurgeon    Types: Snuff  . Tobacco comment: unsure when she quit   Substance Use Topics  . Alcohol use: No    Frequency: Never  . Drug use: No    Review of Systems Per HPI unless specifically indicated above     Objective:    BP (!) 121/53 (BP Location: Left Arm, Patient Position: Sitting, Cuff Size: Large)   Pulse 72   Temp 98.3 F (36.8 C) (Oral)   Ht 5\' 5"  (1.651 m)   Wt 258 lb 12.8 oz (117.4 kg)   BMI 43.07 kg/m    6 minute walk test: at rest SpO2 - 94%, after walk of 200 ft - dropped to 83%, so test stopped after 2 minutes.  Wt Readings from Last 3 Encounters:  07/02/18 258 lb 12.8 oz (117.4 kg)  06/08/18 264 lb (119.7 kg)  03/19/18 265 lb 3.2 oz (120.3 kg)    Physical Exam  Constitutional: She is oriented to person, place, and time. She appears well-developed and well-nourished. No distress.  HENT:  Head: Normocephalic and atraumatic.  Cardiovascular: Normal rate, regular rhythm, S1 normal,  S2 normal and intact distal pulses. Exam reveals distant heart sounds.  No murmur heard. Pulses:      Carotid pulses are 2+ on the right side, and 2+ on the left side.      Radial pulses are 2+ on the right side, and 2+ on the left side.       Posterior tibial pulses are 1+ on the right side, and 1+ on the left side.  Pulmonary/Chest: Effort normal and breath sounds normal. No respiratory distress.  Abdominal: Soft. Bowel sounds are normal. She exhibits no distension.  Neurological: She is alert and oriented to person, place, and time.  Skin: Skin is warm and dry. Capillary refill takes less than 2 seconds. No  rash noted.  Psychiatric: She has a normal mood and affect. Her behavior is normal. Judgment and thought content normal.  Vitals reviewed.  07/02/18 EKG: Normal Sinus Rhythm with RBBB leads II, aVF HR 67bpm PR QT   QTc QRS 96ms      Results for orders placed or performed in visit on 12/30/17  COMPLETE METABOLIC PANEL WITH GFR  Result Value Ref Range   Glucose, Bld 112 (H) 65 - 99 mg/dL   BUN 15 7 - 25 mg/dL   Creat 9.60 4.54 - 0.98 mg/dL   GFR, Est Non African American 71 > OR = 60 mL/min/1.10m2   GFR, Est African American 82 > OR = 60 mL/min/1.65m2   BUN/Creatinine Ratio NOT APPLICABLE 6 - 22 (calc)   Sodium 141 135 - 146 mmol/L   Potassium 3.8 3.5 - 5.3 mmol/L   Chloride 97 (L) 98 - 110 mmol/L   CO2 36 (H) 20 - 32 mmol/L   Calcium 9.6 8.6 - 10.4 mg/dL   Total Protein 7.3 6.1 - 8.1 g/dL   Albumin 3.9 3.6 - 5.1 g/dL   Globulin 3.4 1.9 - 3.7 g/dL (calc)   AG Ratio 1.1 1.0 - 2.5 (calc)   Total Bilirubin 0.5 0.2 - 1.2 mg/dL   Alkaline phosphatase (APISO) 110 33 - 130 U/L   AST 24 10 - 35 U/L   ALT 15 6 - 29 U/L  Lipid panel  Result Value Ref Range   Cholesterol 205 (H) <200 mg/dL   HDL 57 >11 mg/dL   Triglycerides 914 <782 mg/dL   LDL Cholesterol (Calc) 127 (H) mg/dL (calc)   Total CHOL/HDL Ratio 3.6 <5.0 (calc)   Non-HDL Cholesterol (Calc) 148 (H) <130 mg/dL (calc)  Hemoglobin N5A  Result Value Ref Range   Hgb A1c MFr Bld 6.0 (H) <5.7 % of total Hgb   Mean Plasma Glucose 126 (calc)   eAG (mmol/L) 7.0 (calc)      Assessment & Plan:   Problem List Items Addressed This Visit      Cardiovascular and Mediastinum   Hypertension    Controlled hypertension.  Chronic problem that has been managed with multiple medications and has no side effects.  Is not adhering to low-salt diet, but does avoid salting at the table.  Plan:  1. Continue atenolol 25 mg once daily 2.  Continue losartan 25 mg once daily 3.  STOP diltiazem 360 mg 24-hour tab once daily -  CONTINUE amlodipine 4.  Continue torsemide 100 mg once daily.  Need to recheck labs for kidney function 5. Repeat echocardiogram and EKG today since patient is increasingly symptomatic with chest pain at night and with shortness of breath on exertion.  6.  Follow-up 3 months or sooner if needed  after results of above tests return abnormal.      Relevant Orders   COMPLETE METABOLIC PANEL WITH GFR   Coronary artery disease involving native coronary artery with angina pectoris [facility])    Patient with coronary artery disease without angina who has been stable without issues for several years.  She no longer has connection with cardiology but had previously seen Dr. Juliann Pares.  Patient is taking Plavix and other medications to manage hypertension and possible CHF with presence of torsemide on her med list.  CHF diagnosis is unknown, but with increasing CHF symptoms with weight fluctuations/shortness of breath.  Plan: 1.  Continue Plavix until records can be reviewed.   2.  Continue hypertension management. 3. Evaluate possible CHF with echo.  EKG stable today. 4. Follow-up as needed and every year.      Relevant Orders   ECHOCARDIOGRAM COMPLETE   EKG 12-Lead     Other   Hyperlipidemia - Primary    Chronic condition with last labs in 12/2017 with stable lipids. Patient not currently taking any statin.   - Recheck lab with TSH today.  Defer lipid panel for 3 months.  Follow-up in 3 months.      Relevant Orders   COMPLETE METABOLIC PANEL WITH GFR   TSH    Other Visit Diagnoses    Dyspnea on exertion     See AP CAD and HTN above.   Relevant Orders   EKG 12-Lead   6 minute walk   On home oxygen therapy     Patient with need for ongoing oxygen therapy.  May need to increase to use with exertion during daytime.  Will provide order for night use 2 L always and prn daytime with activity and shortness of breath.  [employer], supplier of current O2 concentrator will be notified.   Relevant  Orders   6 minute walk      Follow up plan: Return in about 3 months (around 10/01/2018) for hypertension.  Wilhelmina Mcardle, DNP, AGPCNP-BC Adult Gerontology Primary Care Nurse Practitioner [facility] 07/02/2018, 8:45 AM

## 2018-07-02 NOTE — Assessment & Plan Note (Signed)
Controlled hypertension.  Chronic problem that has been managed with multiple medications and has no side effects.  Is not adhering to low-salt diet, but does avoid salting at the table.  Plan:  1. Continue atenolol 25 mg once daily 2.  Continue losartan 25 mg once daily 3.  STOP diltiazem 360 mg 24-hour tab once daily - CONTINUE amlodipine 4.  Continue torsemide 100 mg once daily.  Need to recheck labs for kidney function 5. Repeat echocardiogram and EKG today since patient is increasingly symptomatic with chest pain at night and with shortness of breath on exertion.  6.  Follow-up 3 months or sooner if needed after results of above tests return abnormal.

## 2018-07-03 LAB — COMPLETE METABOLIC PANEL WITH GFR
AG Ratio: 1.1 (calc) (ref 1.0–2.5)
ALT: 4 U/L — ABNORMAL LOW (ref 6–29)
AST: 10 U/L (ref 10–35)
Albumin: 3.6 g/dL (ref 3.6–5.1)
Alkaline phosphatase (APISO): 111 U/L (ref 33–130)
BUN: 19 mg/dL (ref 7–25)
CO2: 31 mmol/L (ref 20–32)
Calcium: 9.3 mg/dL (ref 8.6–10.4)
Chloride: 102 mmol/L (ref 98–110)
Creat: 0.77 mg/dL (ref 0.60–0.88)
GFR, Est African American: 84 mL/min/{1.73_m2} (ref >=60)
GFR, Est Non African American: 72 mL/min/{1.73_m2} (ref >=60)
Globulin: 3.3 g/dL (calc) (ref 1.9–3.7)
Glucose, Bld: 102 mg/dL — ABNORMAL HIGH (ref 65–99)
Potassium: 4 mmol/L (ref 3.5–5.3)
Sodium: 139 mmol/L (ref 135–146)
Total Bilirubin: 0.4 mg/dL (ref 0.2–1.2)
Total Protein: 6.9 g/dL (ref 6.1–8.1)

## 2018-07-03 LAB — TSH: TSH: 2.49 mIU/L (ref 0.40–4.50)

## 2018-07-07 ENCOUNTER — Other Ambulatory Visit: Payer: Self-pay | Admitting: Nurse Practitioner

## 2018-07-07 DIAGNOSIS — R0609 Other forms of dyspnea: Principal | ICD-10-CM

## 2018-07-07 DIAGNOSIS — I2510 Atherosclerotic heart disease of native coronary artery without angina pectoris: Secondary | ICD-10-CM

## 2018-07-08 DIAGNOSIS — J449 Chronic obstructive pulmonary disease, unspecified: Secondary | ICD-10-CM | POA: Diagnosis not present

## 2018-07-13 ENCOUNTER — Telehealth: Payer: Self-pay | Admitting: Nurse Practitioner

## 2018-07-13 NOTE — Telephone Encounter (Signed)
Pt  wanted to know if she could get more home health to come out she was now home from 8-4:30 by herself,Home Health come out now  Tuesday, Thursday  For 2 hrs. Pt call back # is [phone]

## 2018-07-13 NOTE — Telephone Encounter (Signed)
Has significant mobility difficulties, which are well known. Has had falls while independent and home alone over last 1-2 weeks.  [employer] is currently providing aide assistance for 2 hours on Tuesday and Thursday.  Patient is requesting 3-4 hours 5 days a week as she no longer has the assistance she once did from her daughter. Will contact agency and complete new FL2 form if needed.  Tedd SiasKelita, can you please call and see what they will need from us?

## 2018-07-15 ENCOUNTER — Encounter: Payer: Self-pay | Admitting: Nurse Practitioner

## 2018-07-15 DIAGNOSIS — Z9181 History of falling: Secondary | ICD-10-CM | POA: Insufficient documentation

## 2018-07-15 NOTE — Telephone Encounter (Signed)
PCS forms filled out and faxed over to [facility] to increase [facility]  Hours.

## 2018-07-17 ENCOUNTER — Ambulatory Visit (INDEPENDENT_AMBULATORY_CARE_PROVIDER_SITE_OTHER): Payer: Medicare HMO

## 2018-07-17 ENCOUNTER — Other Ambulatory Visit: Payer: Self-pay

## 2018-07-17 DIAGNOSIS — R0609 Other forms of dyspnea: Secondary | ICD-10-CM | POA: Diagnosis not present

## 2018-07-17 DIAGNOSIS — I2510 Atherosclerotic heart disease of native coronary artery without angina pectoris: Secondary | ICD-10-CM | POA: Diagnosis not present

## 2018-07-23 ENCOUNTER — Other Ambulatory Visit: Payer: Self-pay | Admitting: Nurse Practitioner

## 2018-07-23 DIAGNOSIS — I10 Essential (primary) hypertension: Secondary | ICD-10-CM

## 2018-07-23 DIAGNOSIS — I25119 Atherosclerotic heart disease of native coronary artery with unspecified angina pectoris: Secondary | ICD-10-CM

## 2018-07-23 DIAGNOSIS — I2510 Atherosclerotic heart disease of native coronary artery without angina pectoris: Secondary | ICD-10-CM

## 2018-07-23 DIAGNOSIS — B029 Zoster without complications: Secondary | ICD-10-CM

## 2018-07-23 DIAGNOSIS — E782 Mixed hyperlipidemia: Secondary | ICD-10-CM

## 2018-08-07 DIAGNOSIS — J449 Chronic obstructive pulmonary disease, unspecified: Secondary | ICD-10-CM | POA: Diagnosis not present

## 2018-09-07 DIAGNOSIS — J449 Chronic obstructive pulmonary disease, unspecified: Secondary | ICD-10-CM | POA: Diagnosis not present

## 2018-09-17 ENCOUNTER — Encounter: Payer: Self-pay | Admitting: Cardiovascular Disease

## 2018-09-17 ENCOUNTER — Ambulatory Visit (INDEPENDENT_AMBULATORY_CARE_PROVIDER_SITE_OTHER): Payer: Medicare HMO | Admitting: Cardiovascular Disease

## 2018-09-17 VITALS — BP 120/58 | HR 51 | Ht 65.0 in | Wt 252.0 lb

## 2018-09-17 DIAGNOSIS — I10 Essential (primary) hypertension: Secondary | ICD-10-CM

## 2018-09-17 DIAGNOSIS — E782 Mixed hyperlipidemia: Secondary | ICD-10-CM

## 2018-09-17 DIAGNOSIS — I2510 Atherosclerotic heart disease of native coronary artery without angina pectoris: Secondary | ICD-10-CM

## 2018-09-17 DIAGNOSIS — I5032 Chronic diastolic (congestive) heart failure: Secondary | ICD-10-CM

## 2018-09-17 MED ORDER — ATORVASTATIN CALCIUM 40 MG PO TABS: 40.0000 mg | ORAL_TABLET | Freq: Every day | ORAL | 1 refills | Status: DC

## 2018-09-17 MED ORDER — CARVEDILOL 3.125 MG PO TABS: 3.1250 mg | ORAL_TABLET | Freq: Two times a day (BID) | ORAL | 1 refills | Status: DC

## 2018-09-17 NOTE — Progress Notes (Signed)
Cardiology Office Note   Date:  09/17/2018   ID:  Samantha Cooper, DOB 08-Aug-1935, MRN [id number]  PCP:  Galen Manila, NP  Cardiologist:   Lorine Bears, MD   Chief Complaint  Patient presents with  . New Patient (Initial Visit)    SOB with exertion O2 at night,denies any chest pain. Medications reviewed verbally.       History of Present Illness: Samantha Cooper is a 83 y.o. female who was referred by Andrez Grime for evaluation of exertional dyspnea.  She has known history of coronary artery disease status post bare-metal stent placement in May 2011, hyperlipidemia, essential hypertension and obesity.  She also has significant arthritis. She has been on multiple antihypertensive medication including atenolol, losartan, diltiazem and amlodipine.  Diltiazem was discontinued in September. She had an echocardiogram done in September of this year which showed an EF of 55 to 60%, grade 1 diastolic dysfunction, mild mitral regurgitation and mildly dilated left atrium.  Pulmonary pressure could not be estimated.  I reviewed her cardiac catheterization done in 2011 by Dr. Juliann Pares which showed chronically occluded mid left circumflex with collaterals from the right coronary artery, moderate mid RCA disease, moderate proximal LAD disease and severe mid LAD stenosis.  The mid LAD stenosis was treated successfully with PCI and bare-metal stent placement.  Ejection fraction was normal.  She has chronic diastolic heart failure and has been on torsemide for a while.  She currently denies any chest pain.  She reports that her previous angina manifested by back pain which she does not have now.  She denies leg edema or orthopnea.  Patient was close to 20 minutes late to her appointment.  Past Medical History:  Diagnosis Date  . Heart disease   . History of heart attack   . Hyperlipidemia   . Osteoarthritis 09/26/2017  . Oxygen deficiency     Past Surgical History:    Procedure Laterality Date  . TUBAL LIGATION       Current Outpatient Medications  Medication Sig Dispense Refill  . amLODipine (NORVASC) 10 MG tablet Take 1 tablet (10 mg total) by mouth daily. 90 tablet 1  . aspirin 81 MG tablet aspirin 81 mg tablet,delayed release  take 1 tablet by mouth once daily    . atenolol (TENORMIN) 100 MG tablet TAKE 1 TABLET BY MOUTH EVERY DAY 90 tablet 1  . atorvastatin (LIPITOR) 20 MG tablet Take 1 tablet (20 mg total) by mouth daily. 90 tablet 3  . Blood Pressure Monitoring (BLOOD PRESSURE CUFF) MISC 1 Device by Does not apply route daily. 1 each 0  . clopidogrel (PLAVIX) 75 MG tablet TAKE 1 TABLET BY MOUTH EVERY DAY 90 tablet 1  . DEXILANT 60 MG capsule take 1 capsule by mouth once daily 90 capsule 1  . gabapentin (NEURONTIN) 100 MG capsule TAKE 1 CAPSULE BY MOUTH THREE TIMES DAILY 30 capsule 1  . lidocaine (XYLOCAINE) 5 % ointment Apply 1 application topically as needed. 35.44 g 0  . losartan (COZAAR) 25 MG tablet TAKE 1 TABLET BY MOUTH ONCE DAILY 90 tablet 2  . torsemide (DEMADEX) 100 MG tablet take 1 tablet by mouth once daily 90 tablet 1   No current facility-administered medications for this visit.     Allergies:   Codeine    Social History:  The patient  reports that she has quit smoking. She has a 15.00 pack-year smoking history. She has quit using smokeless tobacco.  Her smokeless tobacco  use included snuff. She reports that she does not drink alcohol or use drugs.   Family History:  The patient's family history includes Heart disease in her mother.    ROS:  Please see the history of present illness.   Otherwise, review of systems are positive for none.   All other systems are reviewed and negative.    PHYSICAL EXAM: VS:  BP (!) 120/58 (BP Location: Right Arm, Patient Position: Sitting, Cuff Size: Large)   Pulse (!) 51   Ht 5\' 5"  (1.651 m)   Wt 252 lb (114.3 kg)   BMI 41.93 kg/m  , BMI Body mass index is 41.93 kg/m. GEN: Well  nourished, well developed, in no acute distress  HEENT: normal  Neck: no JVD, carotid bruits, or masses Cardiac: RRR; no murmurs, rubs, or gallops,no edema  Respiratory:  clear to auscultation bilaterally, normal work of breathing GI: soft, nontender, nondistended, + BS MS: no deformity or atrophy  Skin: warm and dry, no rash Neuro:  Strength and sensation are intact Psych: euthymic mood, full affect   EKG:  EKG is ordered today. The ekg ordered today demonstrates sinus bradycardia with PACs and nonspecific ST and T wave changes.   Recent Labs: 07/02/2018: ALT 4; BUN 19; Creat 0.77; Potassium 4.0; Sodium 139; TSH 2.49    Lipid Panel    Component Value Date/Time   CHOL 205 (H) 12/30/2017 1110   CHOL 157 05/04/2012 0311   TRIG 107 12/30/2017 1110   TRIG 93 05/04/2012 0311   HDL 57 12/30/2017 1110   HDL 58 05/04/2012 0311   CHOLHDL 3.6 12/30/2017 1110   VLDL 19 05/04/2012 0311   LDLCALC 127 (H) 12/30/2017 1110   LDLCALC 80 05/04/2012 0311      Wt Readings from Last 3 Encounters:  09/17/18 252 lb (114.3 kg)  07/02/18 258 lb 12.8 oz (117.4 kg)  06/08/18 264 lb (119.7 kg)      PAD Screen 09/17/2018  Previous PAD dx? No  Previous surgical procedure? No  Pain with walking? Yes  Subsides with rest? Yes  Feet/toe relief with dangling? No  Painful, non-healing ulcers? No  Extremities discolored? No      ASSESSMENT AND PLAN:  1.  1.  Coronary artery disease involving native coronary arteries without angina: The patient had bare-metal stent placement to the mid LAD in 2011 with no recurrent ischemic events.  Continue medical therapy.  Dual antiplatelet therapy is optional at the present time but given her residual coronary artery disease, I elected to keep her on this.  2.  Chronic diastolic heart failure: She appears to be euvolemic on current dose of torsemide.  Recent renal function was normal.  3.  Essential hypertension: She is mildly bradycardic and thus I elected  to switch atenolol to carvedilol 3.125 mg twice daily.  4.  Hyperlipidemia: Most recent LDL was 127.  I elected to increase atorvastatin to 40 mg daily.    Disposition:   FU with me in 4 months  Signed,  Lorine BearsMuhammad Bralee Feldt, MD  09/17/2018 10:05 AM    [facility]

## 2018-09-17 NOTE — Patient Instructions (Signed)
Medication Instructions:  STOP the Atenolol START Carvedilol 3.125 mg twice daily INCREASE the Atorvastatin 40 mg daily  If you need a refill on your cardiac medications before your next appointment, please call your pharmacy.   Lab work: None ordered  Testing/Procedures: None ordered  Follow-Up: At [facility], you and your health needs are our priority.  As part of our continuing mission to provide you with exceptional heart care, we have created designated Provider Care Teams.  These Care Teams include your primary Cardiologist (physician) and Advanced Practice Providers (APPs -  Physician Assistants and Nurse Practitioners) who all work together to provide you with the care you need, when you need it. You will need a follow up appointment in 4 months.  Please call our office 2 months in advance to schedule this appointment.  You may see Dr. Kirke CorinArida or one of the following Advanced Practice Providers on your designated Care Team:   Nicolasa Duckinghristopher Berge, NP Eula Listenyan Dunn, PA-C . Marisue IvanJacquelyn Visser, PA-C

## 2018-09-21 ENCOUNTER — Other Ambulatory Visit: Payer: Self-pay | Admitting: Nurse Practitioner

## 2018-09-21 DIAGNOSIS — I10 Essential (primary) hypertension: Secondary | ICD-10-CM

## 2018-09-21 DIAGNOSIS — I2510 Atherosclerotic heart disease of native coronary artery without angina pectoris: Secondary | ICD-10-CM

## 2018-09-21 MED ORDER — LOSARTAN POTASSIUM 25 MG PO TABS: 25.0000 mg | ORAL_TABLET | Freq: Every day | ORAL | 1 refills | Status: DC

## 2018-09-21 MED ORDER — CLOPIDOGREL BISULFATE 75 MG PO TABS: 75.0000 mg | ORAL_TABLET | Freq: Every day | ORAL | 1 refills | Status: DC

## 2018-09-21 NOTE — Telephone Encounter (Signed)
Pt called requesting refill on plavix 75 mg, losartan 25 mg  Called into [facility]

## 2018-10-07 DIAGNOSIS — J449 Chronic obstructive pulmonary disease, unspecified: Secondary | ICD-10-CM | POA: Diagnosis not present

## 2018-10-18 ENCOUNTER — Other Ambulatory Visit: Payer: Self-pay | Admitting: Nurse Practitioner

## 2018-10-18 DIAGNOSIS — I10 Essential (primary) hypertension: Secondary | ICD-10-CM

## 2018-10-18 MED ORDER — AMLODIPINE BESYLATE 10 MG PO TABS: 10.0000 mg | ORAL_TABLET | Freq: Every day | ORAL | 1 refills | Status: DC

## 2018-10-24 ENCOUNTER — Other Ambulatory Visit: Payer: Self-pay | Admitting: Nurse Practitioner

## 2018-10-24 DIAGNOSIS — I10 Essential (primary) hypertension: Secondary | ICD-10-CM

## 2018-10-24 DIAGNOSIS — K219 Gastro-esophageal reflux disease without esophagitis: Secondary | ICD-10-CM

## 2018-10-24 DIAGNOSIS — I2510 Atherosclerotic heart disease of native coronary artery without angina pectoris: Secondary | ICD-10-CM

## 2018-11-07 DIAGNOSIS — J449 Chronic obstructive pulmonary disease, unspecified: Secondary | ICD-10-CM | POA: Diagnosis not present

## 2018-12-08 DIAGNOSIS — J449 Chronic obstructive pulmonary disease, unspecified: Secondary | ICD-10-CM | POA: Diagnosis not present

## 2018-12-31 ENCOUNTER — Other Ambulatory Visit: Payer: Self-pay | Admitting: Nurse Practitioner

## 2018-12-31 DIAGNOSIS — E782 Mixed hyperlipidemia: Secondary | ICD-10-CM

## 2018-12-31 MED ORDER — ATORVASTATIN CALCIUM 40 MG PO TABS: 40.0000 mg | ORAL_TABLET | Freq: Every day | ORAL | 1 refills | Status: DC

## 2019-01-05 ENCOUNTER — Ambulatory Visit (INDEPENDENT_AMBULATORY_CARE_PROVIDER_SITE_OTHER): Payer: Medicare Other

## 2019-01-05 VITALS — BP 134/60 | HR 72 | Temp 98.2°F | Ht 65.0 in | Wt 267.8 lb

## 2019-01-05 DIAGNOSIS — Z0000 Encounter for general adult medical examination without abnormal findings: Secondary | ICD-10-CM

## 2019-01-05 NOTE — Progress Notes (Signed)
Subjective:   Samantha Cooper is a 84 y.o. female who presents for Medicare Annual (Subsequent) preventive examination.  Review of Systems:   Cardiac Risk Factors include: advanced age (>96men, >56 women);hypertension;dyslipidemia;obesity (BMI >30kg/m2)     Objective:     Vitals: BP 134/60 (BP Location: Left Arm, Patient Position: Sitting, Cuff Size: Normal)   Pulse 72   Temp 98.2 F (36.8 C) (Oral)   Ht 5\' 5"  (1.651 m)   Wt 267 lb 12.8 oz (121.5 kg)   SpO2 96%   BMI 44.56 kg/m   Body mass index is 44.56 kg/m.  Advanced Directives 01/05/2019 03/19/2018 01/15/2018 01/05/2018 12/30/2017  Does Patient Have a Medical Advance Directive? No No No No No  Would patient like information on creating a medical advance directive? Yes (MAU/Ambulatory/Procedural Areas - Information given) - Yes (ED - Information included in AVS) No - Patient declined Yes (MAU/Ambulatory/Procedural Areas - Information given)   A voluntary discussion about advance care planning including the explanation and discussion of advance directives was extensively discussed  with the patient.  Explanation about the health care proxy and Living will was reviewed and packet with forms with explanation of how to fill them out was given.  During this discussion, the patient was able to identify a health care proxy as daughter and granddaughter and plans  to fill out the paperwork required.  Patient was offered a separate Advance Care Planning visit for further assistance with forms.  Time spent with patient:16 minutes.  Individuals present: Samantha Cooper (patient), Samantha Rosenow,LPN    Tobacco Social History   Tobacco Use  Smoking Status Former Smoker  . Packs/day: 1.50  . Years: 10.00  . Pack years: 15.00  Smokeless Tobacco Former Neurosurgeon  . Types: Snuff  Tobacco Comment   unsure when she quit, over 30 years ago      Counseling given: Not Answered Comment: unsure when she quit, over 30 years ago    Clinical  Intake:  Pre-visit preparation completed: Yes  Pain : 0-10 Pain Score: 4  Pain Type: Acute pain Pain Location: Leg Pain Orientation: Right Pain Descriptors / Indicators: Burning Pain Onset: More than a month ago Pain Frequency: Constant Pain Relieving Factors: uses aspercreme   Pain Relieving Factors: uses aspercreme   Nutritional Status: BMI > 30  Obese Nutritional Risks: None Diabetes: No  How often do you need to have someone help you when you read instructions, pamphlets, or other written materials from your doctor or pharmacy?: 1 - Never What is the last grade level you completed in school?: 7th grade  Interpreter Needed?: No  Information entered by :: Samantha Wyke,LPN   Past Medical History:  Diagnosis Date  . Heart disease   . History of heart attack   . Hyperlipidemia   . Osteoarthritis 09/26/2017  . Oxygen deficiency    Past Surgical History:  Procedure Laterality Date  . TUBAL LIGATION     Family History  Problem Relation Age of Onset  . Heart disease Mother    Social History   Socioeconomic History  . Marital status: Divorced    Spouse name: Not on file  . Number of children: Not on file  . Years of education: Not on file  . Highest education level: 7th grade  Occupational History  . Not on file  Social Needs  . Financial resource strain: Not very hard  . Food insecurity:    Worry: Never true    Inability: Never true  .  Transportation needs:    Medical: No    Non-medical: No  Tobacco Use  . Smoking status: Former Smoker    Packs/day: 1.50    Years: 10.00    Pack years: 15.00  . Smokeless tobacco: Former Neurosurgeon    Types: Snuff  . Tobacco comment: unsure when she quit, over 30 years ago   Substance and Sexual Activity  . Alcohol use: No    Frequency: Never  . Drug use: No  . Sexual activity: Not on file  Lifestyle  . Physical activity:    Days per week: 0 days    Minutes per session: 0 min  . Stress: Not at all  Relationships  .  Social connections:    Talks on phone: Twice a week    Gets together: Once a week    Attends religious service: 1 to 4 times per year    Active member of club or organization: No    Attends meetings of clubs or organizations: Never    Relationship status: Divorced  Other Topics Concern  . Not on file  Social History Narrative   Goes to church, no other social activities. Has family that lives close by. Grandson stays the night every other night or so. Her daughter comes by daily.     Outpatient Encounter Medications as of 01/05/2019  Medication Sig  . amLODipine (NORVASC) 10 MG tablet Take 1 tablet (10 mg total) by mouth daily.  Marland Kitchen aspirin 81 MG tablet aspirin 81 mg tablet,delayed release  take 1 tablet by mouth once daily  . atorvastatin (LIPITOR) 40 MG tablet Take 1 tablet (40 mg total) by mouth daily.  . Blood Pressure Monitoring (BLOOD PRESSURE CUFF) MISC 1 Device by Does not apply route daily.  . carvedilol (COREG) 3.125 MG tablet Take 1 tablet (3.125 mg total) by mouth 2 (two) times daily with a meal.  . clopidogrel (PLAVIX) 75 MG tablet Take 1 tablet (75 mg total) by mouth daily.  Marland Kitchen DEXILANT 60 MG capsule TAKE 1 CAPSULE BY MOUTH ONCE DAILY  . lidocaine (XYLOCAINE) 5 % ointment Apply 1 application topically as needed.  Marland Kitchen losartan (COZAAR) 25 MG tablet Take 1 tablet (25 mg total) by mouth daily.  Marland Kitchen torsemide (DEMADEX) 100 MG tablet TAKE 1 TABLET BY MOUTH ONCE DAILY  . gabapentin (NEURONTIN) 100 MG capsule TAKE 1 CAPSULE BY MOUTH THREE TIMES DAILY (Patient not taking: Reported on 01/05/2019)   No facility-administered encounter medications on file as of 01/05/2019.     Activities of Daily Living In your present state of health, do you have any difficulty performing the following activities: 01/05/2019 01/15/2018  Hearing? N N  Vision? N N  Difficulty concentrating or making decisions? N N  Walking or climbing stairs? Y Y  Dressing or bathing? Y N  Comment sometimes, has aid that  helps  -  Doing errands, shopping? [name]  Comment family transports  granddaughter helps witrh errands and takes patient to the doctor  Preparing Food and eating ? N N  Using the Toilet? N N  Comment - uses a commode chair in her room  In the past six months, have you accidently leaked urine? [name]  Comment wears depends  wears depends  Do you have problems with loss of bowel control? N N  Managing your Medications? N N  Managing your Finances? [name]  Comment granddaughter helps daughter [name] helps with finances and paying billls  Housekeeping or managing your Housekeeping? [name]  Comment aid helps  daughter [name] helps with housekeeping  Some recent data might be hidden    Patient Care Team: Galen Manila, NP as PCP - General (Nurse Practitioner) Iran Ouch, MD as Consulting Physician (Cardiology)    Assessment:   This is a routine wellness examination for [facility].  Exercise Activities and Dietary recommendations Current Exercise Habits: The patient does not participate in regular exercise at present, Exercise limited by: None identified  Goals    . DIET - INCREASE WATER INTAKE     Recommend drinking at least 6-8 glasses of water a day        Fall Risk: Fall Risk  01/05/2019 01/15/2018 12/30/2017 09/16/2017  Falls in the past year? 0 Yes Yes No  Number falls in past yr: - 2 or more 2 or more -  Injury with Fall? - No No -  Risk Factor Category  - High Fall Risk High Fall Risk -  Risk for fall due to : - History of fall(s);Impaired balance/gait History of fall(s);Impaired balance/gait -  Follow up - Education provided Falls evaluation completed;Falls prevention discussed -    FALL RISK PREVENTION PERTAINING TO THE HOME:  Any stairs in or around the home? No  If so, are there any without handrails? n/a  Home free of loose throw rugs in walkways, pet beds, electrical cords, etc? Yes  Adequate lighting in your home to reduce risk of falls? Yes   ASSISTIVE DEVICES  UTILIZED TO PREVENT FALLS:  Life alert? Yes  it isnt working, she will contact her insurance to fix it  Use of a cane, walker or w/c? Yes  Grab bars in the bathroom? No  Shower chair or bench in shower? Yes  Elevated toilet seat or a handicapped toilet? No   DME ORDERS:  DME order needed?  No   TIMED UP AND GO:  Was the test performed? Yes .  Length of time to ambulate 10 feet: 13 sec.   GAIT:  Appearance of gait: Gait steady and fastwith the use of an assistive device.  Education: Fall risk prevention has been discussed.  Intervention(s) required? Yes   Discussed importance of life alert or cell phone when ambulating at hopme by herself   Depression Screen PHQ 2/9 Scores 01/05/2019 01/15/2018 01/05/2018 12/30/2017  PHQ - 2 Score 0 0 0 0     Cognitive Function     6CIT Screen 01/05/2019 12/30/2017  What Year? 0 points 0 points  What month? 0 points 0 points  What time? 0 points 0 points  Count back from 20 0 points 0 points  Months in reverse 0 points 2 points  Repeat phrase 0 points 2 points  Total Score 0 4    Immunization History  Administered Date(s) Administered  . Influenza, High Dose Seasonal PF 09/16/2017    Qualifies for Shingles Vaccine? Yes  Zostavax completed n/a. Due for Shingrix. Education has been provided regarding the importance of this vaccine. Pt has been advised to call insurance company to determine out of pocket expense. Advised may also receive vaccine at local pharmacy or Health Dept. Verbalized acceptance and understanding.  Tdap: Although this vaccine is not a covered service during a Wellness Exam, does the patient still wish to receive this vaccine today?  No .  Education has been provided regarding the importance of this vaccine. Advised may receive this vaccine at local pharmacy or Health Dept. Aware to provide a copy of the vaccination record if obtained from  local pharmacy or Health Dept. Verbalized acceptance and understanding.  Flu Vaccine:  Due for Flu vaccine. Does the patient want to receive this vaccine today?  No . Education has been provided regarding the importance of this vaccine but still declined. Advised may receive this vaccine at local pharmacy or Health Dept. Aware to provide a copy of the vaccination record if obtained from local pharmacy or Health Dept. Verbalized acceptance and understanding.  Pneumococcal Vaccine: Due for Pneumococcal vaccine. Does the patient want to receive this vaccine today?  No . Education has been provided regarding the importance of this vaccine but still declined. Advised may receive this vaccine at local pharmacy or Health Dept. Aware to provide a copy of the vaccination record if obtained from local pharmacy or Health Dept. Verbalized acceptance and understanding.   Screening Tests Health Maintenance  Topic Date Due  . INFLUENZA VACCINE  06/29/2019 (Originally 05/28/2018)  . DEXA SCAN  01/05/2020 (Originally 03/22/2002)  . TETANUS/TDAP  01/05/2020 (Originally 03/22/1954)  . PNA vac Low Risk Adult (1 of 2 - PCV13) 01/05/2020 (Originally 03/22/2002)    Cancer Screenings:  Colorectal Screening: no longer required  Mammogram: no longer required  Bone Density: declined  Lung Cancer Screening: (Low Dose CT Chest recommended if Age 58-80 years, 30 pack-year currently smoking OR have quit w/in 15years.) does not qualify.    Additional Screening:  Hepatitis C Screening: does not qualify  Vision Screening: Recommended annual ophthalmology exams for early detection of glaucoma and other disorders of the eye. Is the patient up to date with their annual eye exam?  Yes  Who is the provider or what is the name of the office in which the pt attends annual eye exams? [facility]   Dental Screening: Recommended annual dental exams for proper oral hygiene  Community Resource Referral:  CRR required this visit?  No       Plan:  I have personally reviewed and addressed the Medicare  Annual Wellness questionnaire and have noted the following in the patient's chart:  A. Medical and social history B. Use of alcohol, tobacco or illicit drugs  C. Current medications and supplements D. Functional ability and status E.  Nutritional status F.  Physical activity G. Advance directives H. List of other physicians I.  Hospitalizations, surgeries, and ER visits in previous 12 months J.  Vitals K. Screenings such as hearing and vision if needed, cognitive and depression L. Referrals and appointments   In addition, I have reviewed and discussed with patient certain preventive protocols, quality metrics, and best practice recommendations. A written personalized care plan for preventive services as well as general preventive health recommendations were provided to patient. Nurse Health Advisor  Signed,    [place], Truddie Hiddeniffany A, [place]LPN  1/61/09603/07/2019 Nurse Health Advisor   Nurse Notes: Micah FlesherWent over medications with patient, patient brought medications with her and we discovered she is taking atorvastatin 20 mg and atorvastatin 40mg . Discussed with patient she should only be taking 40 mg total of atorvastatin. Gave her the option to finish her bottle of 20mg  meaning she will take 2 tablets a day or take 1 40mg  tablet a day. She decided it would be easier to take the 40mg  tablet. Patient put X on bottle of the 20mg  tablets and we fixed her weekly pill container with only 40mg  tablet. She currently is out of her carvedilol which gives her a total of 7 pills a day and will be 8 pills when she gets that refilled by  cardiologist. She understands to count her pills to make sure the total number is correct before taking them each day. She was informed to call our office if she runs into any more problems or questions about her medications.   Advanced directive encounter completed.

## 2019-01-05 NOTE — Patient Instructions (Addendum)
Samantha Cooper , Thank you for taking time to come for your Medicare Wellness Visit. I appreciate your ongoing commitment to your health goals. Please review the following plan we discussed and let me know if I can assist you in the future.   Screening recommendations/referrals: Colonoscopy: no longer required Mammogram: no longer required  Bone Density: declined Recommended yearly ophthalmology/optometry visit for glaucoma screening and checkup Recommended yearly dental visit for hygiene and checkup  Vaccinations: Influenza vaccine: declined Pneumococcal vaccine: declined  Tdap vaccine: due, check with your insurance company for coverage Shingles vaccine: shingrix eligible, check with your insurance company for coverage    Advanced directives: Advance directive discussed with you today. I have provided a copy for you to complete at home and have notarized. Once this is complete please bring a copy in to our office so we can scan it into your chart.  Conditions/risks identified: discussed medication duplicates. Please call if you have any questions or concerns about your medications. You should have a total of 8 medications daily after refilling your carvedilol.  Please get life alert checked and/or carry phone with you when you are at home by yourself.   Next appointment: Follow up in one year for your annual wellness visit.    Preventive Care 82 Years and Older, Female Preventive care refers to lifestyle choices and visits with your health care provider that can promote health and wellness. What does preventive care include?  A yearly physical exam. This is also called an annual well check.  Dental exams once or twice a year.  Routine eye exams. Ask your health care provider how often you should have your eyes checked.  Personal lifestyle choices, including:  Daily care of your teeth and gums.  Regular physical activity.  Eating a healthy diet.  Avoiding tobacco and drug  use.  Limiting alcohol use.  Practicing safe sex.  Taking low-dose aspirin every day.  Taking vitamin and mineral supplements as recommended by your health care provider. What happens during an annual well check? The services and screenings done by your health care provider during your annual well check will depend on your age, overall health, lifestyle risk factors, and family history of disease. Counseling  Your health care provider may ask you questions about your:  Alcohol use.  Tobacco use.  Drug use.  Emotional well-being.  Home and relationship well-being.  Sexual activity.  Eating habits.  History of falls.  Memory and ability to understand (cognition).  Work and work Astronomer.  Reproductive health. Screening  You may have the following tests or measurements:  Height, weight, and BMI.  Blood pressure.  Lipid and cholesterol levels. These may be checked every 5 years, or more frequently if you are over 43 years old.  Skin check.  Lung cancer screening. You may have this screening every year starting at age 1 if you have a 30-pack-year history of smoking and currently smoke or have quit within the past 15 years.  Fecal occult blood test (FOBT) of the stool. You may have this test every year starting at age 68.  Flexible sigmoidoscopy or colonoscopy. You may have a sigmoidoscopy every 5 years or a colonoscopy every 10 years starting at age 38.  Hepatitis C blood test.  Hepatitis B blood test.  Sexually transmitted disease (STD) testing.  Diabetes screening. This is done by checking your blood sugar (glucose) after you have not eaten for a while (fasting). You may have this done every 1-3 years.  Bone  density scan. This is done to screen for osteoporosis. You may have this done starting at age 34.  Mammogram. This may be done every 1-2 years. Talk to your health care provider about how often you should have regular mammograms. Talk with your  health care provider about your test results, treatment options, and if necessary, the need for more tests. Vaccines  Your health care provider may recommend certain vaccines, such as:  Influenza vaccine. This is recommended every year.  Tetanus, diphtheria, and acellular pertussis (Tdap, Td) vaccine. You may need a Td booster every 10 years.  Zoster vaccine. You may need this after age 51.  Pneumococcal 13-valent conjugate (PCV13) vaccine. One dose is recommended after age 37.  Pneumococcal polysaccharide (PPSV23) vaccine. One dose is recommended after age 74. Talk to your health care provider about which screenings and vaccines you need and how often you need them. This information is not intended to replace advice given to you by your health care provider. Make sure you discuss any questions you have with your health care provider. Document Released: 11/10/2015 Document Revised: 07/03/2016 Document Reviewed: 08/15/2015 Elsevier Interactive Patient Education  2017 [place] Prevention in the Home Falls can cause injuries. They can happen to people of all ages. There are many things you can do to make your home safe and to help prevent falls. What can I do on the outside of my home?  Regularly fix the edges of walkways and driveways and fix any cracks.  Remove anything that might make you trip as you walk through a door, such as a raised step or threshold.  Trim any bushes or trees on the path to your home.  Use bright outdoor lighting.  Clear any walking paths of anything that might make someone trip, such as rocks or tools.  Regularly check to see if handrails are loose or broken. Make sure that both sides of any steps have handrails.  Any raised decks and porches should have guardrails on the edges.  Have any leaves, snow, or ice cleared regularly.  Use sand or salt on walking paths during winter.  Clean up any spills in your garage right away. This includes oil  or grease spills. What can I do in the bathroom?  Use night lights.  Install grab bars by the toilet and in the tub and shower. Do not use towel bars as grab bars.  Use non-skid mats or decals in the tub or shower.  If you need to sit down in the shower, use a plastic, non-slip stool.  Keep the floor dry. Clean up any water that spills on the floor as soon as it happens.  Remove soap buildup in the tub or shower regularly.  Attach bath mats securely with double-sided non-slip rug tape.  Do not have throw rugs and other things on the floor that can make you trip. What can I do in the bedroom?  Use night lights.  Make sure that you have a light by your bed that is easy to reach.  Do not use any sheets or blankets that are too big for your bed. They should not hang down onto the floor.  Have a firm chair that has side arms. You can use this for support while you get dressed.  Do not have throw rugs and other things on the floor that can make you trip. What can I do in the kitchen?  Clean up any spills right away.  Avoid walking on  wet floors.  Keep items that you use a lot in easy-to-reach places.  If you need to reach something above you, use a strong step stool that has a grab bar.  Keep electrical cords out of the way.  Do not use floor polish or wax that makes floors slippery. If you must use wax, use non-skid floor wax.  Do not have throw rugs and other things on the floor that can make you trip. What can I do with my stairs?  Do not leave any items on the stairs.  Make sure that there are handrails on both sides of the stairs and use them. Fix handrails that are broken or loose. Make sure that handrails are as long as the stairways.  Check any carpeting to make sure that it is firmly attached to the stairs. Fix any carpet that is loose or worn.  Avoid having throw rugs at the top or bottom of the stairs. If you do have throw rugs, attach them to the floor with  carpet tape.  Make sure that you have a light switch at the top of the stairs and the bottom of the stairs. If you do not have them, ask someone to add them for you. What else can I do to help prevent falls?  Wear shoes that:  Do not have high heels.  Have rubber bottoms.  Are comfortable and fit you well.  Are closed at the toe. Do not wear sandals.  If you use a stepladder:  Make sure that it is fully opened. Do not climb a closed stepladder.  Make sure that both sides of the stepladder are locked into place.  Ask someone to hold it for you, if possible.  Clearly mark and make sure that you can see:  Any grab bars or handrails.  First and last steps.  Where the edge of each step is.  Use tools that help you move around (mobility aids) if they are needed. These include:  Canes.  Walkers.  Scooters.  Crutches.  Turn on the lights when you go into a dark area. Replace any light bulbs as soon as they burn out.  Set up your furniture so you have a clear path. Avoid moving your furniture around.  If any of your floors are uneven, fix them.  If there are any pets around you, be aware of where they are.  Review your medicines with your doctor. Some medicines can make you feel dizzy. This can increase your chance of falling. Ask your doctor what other things that you can do to help prevent falls. This information is not intended to replace advice given to you by your health care provider. Make sure you discuss any questions you have with your health care provider. Document Released: 08/10/2009 Document Revised: 03/21/2016 Document Reviewed: 11/18/2014 Elsevier Interactive Patient Education  2017 [employer].

## 2019-01-06 DIAGNOSIS — J449 Chronic obstructive pulmonary disease, unspecified: Secondary | ICD-10-CM | POA: Diagnosis not present

## 2019-01-06 NOTE — Progress Notes (Signed)
Cardiology Office Note   Date:  01/07/2019   ID:  Marah, Wiget January 03, 1935, MRN [id number]  PCP:  Galen Manila, NP  Cardiologist:  Lorine Bears, MD   Chief Complaint  Patient presents with  . other    4 month follow up. Pt. c/o chest pain, shortness of breath and bilateral LE edema.       History of Present Illness: Samantha Cooper is a 84 y.o. female who is here today for follow-up visit regarding coronary artery disease.   She has known history of coronary artery disease status post bare-metal stent placement in May 2011, hyperlipidemia, essential hypertension and obesity.  She also has significant arthritis.  She had an echocardiogram done in September 2019 which showed an EF of 55 to 60%, grade 1 diastolic dysfunction, mild mitral regurgitation and mildly dilated left atrium.  Pulmonary pressure could not be estimated.  I reviewed her cardiac catheterization done in 2011 by Dr. Juliann Pares which showed chronically occluded mid left circumflex with collaterals from the right coronary artery, moderate mid RCA disease, moderate proximal LAD disease and severe mid LAD stenosis.  The mid LAD stenosis was treated successfully with PCI and bare-metal stent placement.  Ejection fraction was normal.  The patient is not doing well today. She is accompanied by her caregiver today. At night she experiences chest pain and shortness of breath. The pain is a burning sensation which is probably caused by acid reflux. Certain foods exacerbate the pain at night. She is taking Dexilant for acid reflux. She is compliant with torsemide. The shortness of breath is onset by exertion, but there is no chest pain associated. She has leg swelling. Her provider believes that she has a cyst in her right leg. At night her mouth gets really dry and she drinks a lot of water for it She denies palpitations or any other related symptoms or complaints at this time.    Past Medical History:   Diagnosis Date  . Heart disease   . History of heart attack   . Hyperlipidemia   . Osteoarthritis 09/26/2017  . Oxygen deficiency     Past Surgical History:  Procedure Laterality Date  . TUBAL LIGATION       Current Outpatient Medications  Medication Sig Dispense Refill  . amLODipine (NORVASC) 10 MG tablet Take 1 tablet (10 mg total) by mouth daily. 90 tablet 1  . aspirin 81 MG tablet aspirin 81 mg tablet,delayed release  take 1 tablet by mouth once daily    . atorvastatin (LIPITOR) 40 MG tablet Take 1 tablet (40 mg total) by mouth daily. 90 tablet 1  . Blood Pressure Monitoring (BLOOD PRESSURE CUFF) MISC 1 Device by Does not apply route daily. 1 each 0  . carvedilol (COREG) 3.125 MG tablet Take 1 tablet (3.125 mg total) by mouth 2 (two) times daily with a meal. 180 tablet 1  . clopidogrel (PLAVIX) 75 MG tablet Take 1 tablet (75 mg total) by mouth daily. 90 tablet 1  . DEXILANT 60 MG capsule TAKE 1 CAPSULE BY MOUTH ONCE DAILY 90 capsule 1  . gabapentin (NEURONTIN) 100 MG capsule TAKE 1 CAPSULE BY MOUTH THREE TIMES DAILY (Patient not taking: Reported on 01/05/2019) 30 capsule 1  . lidocaine (XYLOCAINE) 5 % ointment Apply 1 application topically as needed. 35.44 g 0  . losartan (COZAAR) 25 MG tablet Take 1 tablet (25 mg total) by mouth daily. 90 tablet 1  . torsemide (DEMADEX) 100 MG tablet  TAKE 1 TABLET BY MOUTH ONCE DAILY 90 tablet 1   No current facility-administered medications for this visit.     Allergies:   Codeine    Social History:  The patient  reports that she has quit smoking. She has a 15.00 pack-year smoking history. She has quit using smokeless tobacco.  Her smokeless tobacco use included snuff. She reports that she does not drink alcohol or use drugs.   Family History:  The patient's family history includes Heart disease in her mother.    ROS:  Please see the history of present illness.   Otherwise, review of systems are positive for none.   All other systems are  reviewed and negative.    PHYSICAL EXAM: VS:  BP 120/68 (BP Location: Left Arm, Patient Position: Sitting, Cuff Size: Normal)   Pulse 70   Ht 5\' 5"  (1.651 m)   Wt 266 lb 4 oz (120.8 kg)   SpO2 96%   BMI 44.31 kg/m  , BMI Body mass index is 44.31 kg/m. GEN: Well nourished, well developed, in no acute distress  HEENT: normal  Neck: no JVD, carotid bruits, or masses Cardiac: RRR; no murmurs, rubs, or gallops, edema bilaterally lower extremities Respiratory:  clear to auscultation bilaterally, normal work of breathing GI: soft, nontender, nondistended, + BS MS: no deformity or atrophy  Skin: warm and dry, no rash Neuro:  Strength and sensation are intact Psych: euthymic mood, full affect   EKG:  EKG is ordered today. The ekg ordered today demonstrates normal sinus rhythm with no significant ST or T wave changes.   Recent Labs: 07/02/2018: ALT 4; BUN 19; Creat 0.77; Potassium 4.0; Sodium 139; TSH 2.49    Lipid Panel    Component Value Date/Time   CHOL 205 (H) 12/30/2017 1110   CHOL 157 05/04/2012 0311   TRIG 107 12/30/2017 1110   TRIG 93 05/04/2012 0311   HDL 57 12/30/2017 1110   HDL 58 05/04/2012 0311   CHOLHDL 3.6 12/30/2017 1110   VLDL 19 05/04/2012 0311   LDLCALC 127 (H) 12/30/2017 1110   LDLCALC 80 05/04/2012 0311      Wt Readings from Last 3 Encounters:  01/07/19 266 lb 4 oz (120.8 kg)  01/05/19 267 lb 12.8 oz (121.5 kg)  09/17/18 252 lb (114.3 kg)      PAD Screen 09/17/2018  Previous PAD dx? No  Previous surgical procedure? No  Pain with walking? Yes  Subsides with rest? Yes  Feet/toe relief with dangling? No  Painful, non-healing ulcers? No  Extremities discolored? No      ASSESSMENT AND PLAN:  1.  1.  Coronary artery disease involving native coronary arteries without angina: The patient had bare-metal stent placement to the mid LAD in 2011 with no recurrent ischemic events.  Continue medical therapy.  Recent episode of chest pain at night and  early morning seems to be due to GERD.  There has been no change in the pattern of her chest pain and her EKG is normal.  I do not think she requires further ischemic work-up at the present time.  2.  Chronic diastolic heart failure: She appears to be euvolemic on current dose of torsemide.   3.  Essential hypertension: Blood pressure is controlled on current agents.  Bradycardia resolved after switching her from atenolol to carvedilol.  4.  Hyperlipidemia: Continue atorvastatin 40 mg once daily.    Disposition:   FU with me in 6 months  I, Jesus Reyes am acting as  a scribe for Lorine Bears, M.D.  I have reviewed the above documentation for accuracy and completeness, and I agree with the above.    Signed, Lorine Bears, MD 01/07/19 [facility] [place], [place] [phone]

## 2019-01-07 ENCOUNTER — Other Ambulatory Visit: Payer: Self-pay

## 2019-01-07 ENCOUNTER — Encounter: Payer: Self-pay | Admitting: Cardiovascular Disease

## 2019-01-07 ENCOUNTER — Ambulatory Visit (INDEPENDENT_AMBULATORY_CARE_PROVIDER_SITE_OTHER): Payer: Medicare Other | Admitting: Cardiovascular Disease

## 2019-01-07 VITALS — BP 120/68 | HR 70 | Ht 65.0 in | Wt 266.2 lb

## 2019-01-07 DIAGNOSIS — I10 Essential (primary) hypertension: Secondary | ICD-10-CM | POA: Diagnosis not present

## 2019-01-07 DIAGNOSIS — I5032 Chronic diastolic (congestive) heart failure: Secondary | ICD-10-CM | POA: Diagnosis not present

## 2019-01-07 DIAGNOSIS — E785 Hyperlipidemia, unspecified: Secondary | ICD-10-CM | POA: Diagnosis not present

## 2019-01-07 DIAGNOSIS — I2510 Atherosclerotic heart disease of native coronary artery without angina pectoris: Secondary | ICD-10-CM

## 2019-01-07 NOTE — Patient Instructions (Signed)

## 2019-01-11 ENCOUNTER — Ambulatory Visit (INDEPENDENT_AMBULATORY_CARE_PROVIDER_SITE_OTHER): Payer: Medicare Other | Admitting: Nurse Practitioner

## 2019-01-11 ENCOUNTER — Other Ambulatory Visit: Payer: Self-pay | Admitting: Nurse Practitioner

## 2019-01-11 ENCOUNTER — Other Ambulatory Visit: Payer: Self-pay

## 2019-01-11 VITALS — BP 120/60 | HR 62 | Resp 15 | Ht 65.0 in | Wt 264.6 lb

## 2019-01-11 DIAGNOSIS — K219 Gastro-esophageal reflux disease without esophagitis: Secondary | ICD-10-CM | POA: Diagnosis not present

## 2019-01-11 DIAGNOSIS — E782 Mixed hyperlipidemia: Secondary | ICD-10-CM

## 2019-01-11 DIAGNOSIS — R7309 Other abnormal glucose: Secondary | ICD-10-CM | POA: Diagnosis not present

## 2019-01-11 DIAGNOSIS — I10 Essential (primary) hypertension: Secondary | ICD-10-CM

## 2019-01-11 DIAGNOSIS — Z0000 Encounter for general adult medical examination without abnormal findings: Secondary | ICD-10-CM | POA: Diagnosis not present

## 2019-01-11 MED ORDER — FAMOTIDINE 20 MG PO TABS: 20.0000 mg | ORAL_TABLET | Freq: Two times a day (BID) | ORAL | 0 refills | Status: DC

## 2019-01-11 NOTE — Progress Notes (Signed)
Subjective:    Patient ID: Samantha Cooper, female    DOB: [birth year], 84 y.o.   MRN: [id number]  Samantha Cooper is a 84 y.o. female presenting on 01/11/2019 for Annual Exam   HPI Annual Physical Exam Patient has been feeling well.  They have no acute concerns today. Sleeps 7-8 hours per night uninterrupted.  HEALTH MAINTENANCE: Weight/BMI: stable Physical activity: gets up and down around home, cleaning occasionally Diet: generally healthy, low salt Seatbelt: always  Sunscreen: rarely - not usually outsie Colon Cancer Screen: up to date Optometry: regular Dentistry: regularly  VACCINES: Shingles: desires vaccine - instructions provided to ask about cost, call clinic for orders to [facility] pharmacy if not able to access at her [facility]  Hyperlipidemia Patient is taking only atorvastatin 40 mg today and has made sure to update me about her change after her AWV with Tiffany.    GERD Patient continues on Dexilant.  Patient continues to have heartburn on medication.   - She reports no n/v, coffee ground emesis, dark/black/tarry stool, BRBPR, or other GI bleeding.   Epidermal Cyst Patient has seen dermatology, but cyst was not removed due to asymptomatic nature and presence on lower extremity with risk of infection/circulatory complications.  Patient reports increasing pain, continues to be relieved some with aspercreme and compression.  Patient does not regularly wear compression socks.      Hypertension/CAD/CHF Patient is managed by Dr. Kirke Corin.  Had stable checkup last week.  Patient reports she continues taking daily weights and daily BP readings.     Social History   Tobacco Use  . Smoking status: Former Smoker    Packs/day: 1.50    Years: 10.00    Pack years: 15.00  . Smokeless tobacco: Former Neurosurgeon    Types: Snuff  . Tobacco comment: unsure when she quit, over 30 years ago   Substance Use Topics  . Alcohol use: No    Frequency: Never  . Drug use: No    Review  of Systems Per HPI unless specifically indicated above     Objective:    BP 120/60 (BP Location: Left Arm, Patient Position: Sitting, Cuff Size: Large)   Pulse 62   Resp 15   Ht 5\' 5"  (1.651 m)   Wt 264 lb 9.6 oz (120 kg)   SpO2 98%   BMI 44.03 kg/m   Wt Readings from Last 3 Encounters:  01/11/19 264 lb 9.6 oz (120 kg)  01/07/19 266 lb 4 oz (120.8 kg)  01/05/19 267 lb 12.8 oz (121.5 kg)    Physical Exam Vitals signs reviewed.  Constitutional:      General: She is awake. She is not in acute distress.    Appearance: She is well-developed. She is morbidly obese.  HENT:     Head: Normocephalic and atraumatic.     Mouth/Throat:     Mouth: Mucous membranes are moist.     Pharynx: Oropharynx is clear.  Neck:     Musculoskeletal: Normal range of motion and neck supple.     Vascular: No carotid bruit.  Cardiovascular:     Rate and Rhythm: Normal rate and regular rhythm.     Pulses:          Radial pulses are 2+ on the right side and 2+ on the left side.       Posterior tibial pulses are 1+ on the right side and 1+ on the left side.     Heart sounds: Normal heart sounds, S1  normal and S2 normal.  Pulmonary:     Effort: Pulmonary effort is normal. No respiratory distress.     Breath sounds: Normal breath sounds and air entry.  Abdominal:     General: Bowel sounds are normal. There is no distension.     Palpations: Abdomen is soft. There is no hepatomegaly (scratch test) or splenomegaly (scratch test).     Tenderness: There is no abdominal tenderness.     Hernia: No hernia is present.  Skin:    General: Skin is warm and dry.     Capillary Refill: Capillary refill takes less than 2 seconds.  Neurological:     General: No focal deficit present.     Mental Status: She is alert and oriented to person, place, and time. Mental status is at baseline.  Psychiatric:        Attention and Perception: Attention normal.        Mood and Affect: Mood and affect normal.        Behavior:  Behavior normal. Behavior is cooperative.        Thought Content: Thought content normal.        Judgment: Judgment normal.    Results for orders placed or performed in visit on 07/02/18  COMPLETE METABOLIC PANEL WITH GFR  Result Value Ref Range   Glucose, Bld 102 (H) 65 - 99 mg/dL   BUN 19 7 - 25 mg/dL   Creat 2.58 5.27 - 7.82 mg/dL   GFR, Est Non African American 72 > OR = 60 mL/min/1.85m2   GFR, Est African American 84 > OR = 60 mL/min/1.63m2   BUN/Creatinine Ratio NOT APPLICABLE 6 - 22 (calc)   Sodium 139 135 - 146 mmol/L   Potassium 4.0 3.5 - 5.3 mmol/L   Chloride 102 98 - 110 mmol/L   CO2 31 20 - 32 mmol/L   Calcium 9.3 8.6 - 10.4 mg/dL   Total Protein 6.9 6.1 - 8.1 g/dL   Albumin 3.6 3.6 - 5.1 g/dL   Globulin 3.3 1.9 - 3.7 g/dL (calc)   AG Ratio 1.1 1.0 - 2.5 (calc)   Total Bilirubin 0.4 0.2 - 1.2 mg/dL   Alkaline phosphatase (APISO) 111 33 - 130 U/L   AST 10 10 - 35 U/L   ALT 4 (L) 6 - 29 U/L  TSH  Result Value Ref Range   TSH 2.49 0.40 - 4.50 mIU/L      Assessment & Plan:   Problem List Items Addressed This Visit      Cardiovascular and Mediastinum   Hypertension Stable today on exam.  Medications tolerated without side effects.  Continue at current doses.  Refills provided.  Check labs today. Followup 6 months.      Digestive   GERD (gastroesophageal reflux disease) Stable today on exam.  No signs and symptoms of bleeding. Medications tolerated without side effects.  Continue at current doses.  Refills provided.  Check labs today. Followup 6 months.    Relevant Medications   famotidine (PEPCID) 20 MG tablet   Other Relevant Orders   COMPLETE METABOLIC PANEL WITH GFR (Completed)   CBC with Differential/Platelet (Completed)     Other   Hyperlipidemia Stable today on exam.  No ASCVD events since last visit.  Personal history of MI and CHF.  Medications tolerated without side effects.  Continue at current doses.  Refills provided.  Check labs today. Followup 6  months.    Relevant Orders   COMPLETE METABOLIC PANEL WITH GFR (Completed)  Lipid panel (Completed)   Elevated glucose Status unknown.  Recheck labs. Refills provided. Followup prn after labs.    Relevant Orders   Hemoglobin A1c (Completed)    Other Visit Diagnoses    Encounter for annual physical exam    -  Primary Annual physical exam with no new findings.  Well adult with no acute concerns.  Plan: 1. Obtain health maintenance screenings as above according to age. - Increase physical activity to 30 minutes most days of the week.  - Eat healthy diet high in vegetables and fruits; low in refined carbohydrates. - Screening labs and tests as ordered 2. Return 1 year for annual physical.       Meds ordered this encounter  Medications  . famotidine (PEPCID) 20 MG tablet    Sig: Take 1 tablet (20 mg total) by mouth 2 (two) times daily for 14 days.    Dispense:  28 tablet    Refill:  0    Order Specific Question:   Supervising Provider    Answer:   Smitty Cords [[id number]]    Follow up plan: Return in about 6 months (around 07/14/2019) for hypertension.  Wilhelmina Mcardle, DNP, AGPCNP-BC Adult Gerontology Primary Care Nurse Practitioner [facility] 01/11/2019, 9:42 AM

## 2019-01-11 NOTE — Patient Instructions (Addendum)
Samantha Cooper,   Thank you for coming in to clinic today.  1. No changes on any medications today - STOP automatic refills.  ALSO ask pharmacy to stop filling your atorvastatin 20 mg tablets and your atenolol.  Please schedule a follow-up appointment with Wilhelmina Mcardle, AGNP. Return in about 6 months (around 07/14/2019) for hypertension.  If you have any other questions or concerns, please feel free to call the clinic or send a message through MyChart. You may also schedule an earlier appointment if necessary.  You will receive a survey after today's visit either digitally by e-mail or paper by [employer]. Your experiences and feedback matter to [place].  Please respond so we know how we are doing as we provide care for you.  Wilhelmina Mcardle, DNP, AGNP-BC Adult Gerontology Nurse Practitioner [facility], [facility]

## 2019-01-12 LAB — LIPID PANEL
Cholesterol: 162 mg/dL (ref <200)
HDL: 52 mg/dL (ref >=50)
LDL Cholesterol (Calc): 92 mg/dL (calc)
Non-HDL Cholesterol (Calc): 110 mg/dL (calc) (ref <130)
Total CHOL/HDL Ratio: 3.1 (calc) (ref <5.0)
Triglycerides: 88 mg/dL (ref <150)

## 2019-01-12 LAB — COMPLETE METABOLIC PANEL WITH GFR
AG Ratio: 1.2 (calc) (ref 1.0–2.5)
ALT: 4 U/L — ABNORMAL LOW (ref 6–29)
AST: 12 U/L (ref 10–35)
Albumin: 3.7 g/dL (ref 3.6–5.1)
Alkaline phosphatase (APISO): 107 U/L (ref 37–153)
BUN/Creatinine Ratio: 27 (calc) — ABNORMAL HIGH (ref 6–22)
BUN: 16 mg/dL (ref 7–25)
CO2: 33 mmol/L — ABNORMAL HIGH (ref 20–32)
Calcium: 9.2 mg/dL (ref 8.6–10.4)
Chloride: 102 mmol/L (ref 98–110)
Creat: 0.59 mg/dL — ABNORMAL LOW (ref 0.60–0.88)
GFR, Est African American: 100 mL/min/{1.73_m2} (ref >=60)
GFR, Est Non African American: 86 mL/min/{1.73_m2} (ref >=60)
Globulin: 3.1 g/dL (calc) (ref 1.9–3.7)
Glucose, Bld: 98 mg/dL (ref 65–99)
Potassium: 4.1 mmol/L (ref 3.5–5.3)
Sodium: 142 mmol/L (ref 135–146)
Total Bilirubin: 0.5 mg/dL (ref 0.2–1.2)
Total Protein: 6.8 g/dL (ref 6.1–8.1)

## 2019-01-12 LAB — CBC WITH DIFFERENTIAL/PLATELET
Absolute Monocytes: 447 cells/uL (ref 200–950)
Basophils Absolute: 21 cells/uL (ref 0–200)
Basophils Relative: 0.3 %
Eosinophils Absolute: 71 cells/uL (ref 15–500)
Eosinophils Relative: 1 %
HCT: 42.1 % (ref 35.0–45.0)
Hemoglobin: 13.4 g/dL (ref 11.7–15.5)
Lymphs Abs: 2371 cells/uL (ref 850–3900)
MCH: 28.5 pg (ref 27.0–33.0)
MCHC: 31.8 g/dL — ABNORMAL LOW (ref 32.0–36.0)
MCV: 89.4 fL (ref 80.0–100.0)
MPV: 10.7 fL (ref 7.5–12.5)
Monocytes Relative: 6.3 %
Neutro Abs: 4189 cells/uL (ref 1500–7800)
Neutrophils Relative %: 59 %
Platelets: 228 10*3/uL (ref 140–400)
RBC: 4.71 10*6/uL (ref 3.80–5.10)
RDW: 12.7 % (ref 11.0–15.0)
Total Lymphocyte: 33.4 %
WBC: 7.1 10*3/uL (ref 3.8–10.8)

## 2019-01-12 LAB — HEMOGLOBIN A1C
Hgb A1c MFr Bld: 6.1 % of total Hgb — ABNORMAL HIGH (ref <5.7)
Mean Plasma Glucose: 128 (calc)
eAG (mmol/L): 7.1 (calc)

## 2019-01-18 ENCOUNTER — Encounter: Payer: Self-pay | Admitting: Nurse Practitioner

## 2019-01-18 DIAGNOSIS — R7309 Other abnormal glucose: Secondary | ICD-10-CM | POA: Insufficient documentation

## 2019-02-06 DIAGNOSIS — J449 Chronic obstructive pulmonary disease, unspecified: Secondary | ICD-10-CM | POA: Diagnosis not present

## 2019-03-08 DIAGNOSIS — J449 Chronic obstructive pulmonary disease, unspecified: Secondary | ICD-10-CM | POA: Diagnosis not present

## 2019-03-16 ENCOUNTER — Other Ambulatory Visit: Payer: Self-pay

## 2019-04-07 ENCOUNTER — Telehealth: Payer: Self-pay

## 2019-04-07 NOTE — Telephone Encounter (Signed)
I received a phone call from Dominica from [employer] requesting a order for Incontinence supplies ( Pull-up qty 200 a month, wipes 50 per mth and gloves as needed. The previous diagnosis code from Dr. Brynda Greathouse was N39.4. The pt was given supplies for this month, but her prescription from Dr. Brynda Greathouse has expired.  No order form available. She said it normally written out on a prescription paper. [phone]

## 2019-04-08 DIAGNOSIS — J449 Chronic obstructive pulmonary disease, unspecified: Secondary | ICD-10-CM | POA: Diagnosis not present

## 2019-04-08 NOTE — Telephone Encounter (Signed)
Written rx on pad To be faxed  Nobie Putnam, [facility] 04/08/2019, 11:49 AM

## 2019-05-08 DIAGNOSIS — J449 Chronic obstructive pulmonary disease, unspecified: Secondary | ICD-10-CM | POA: Diagnosis not present

## 2019-05-13 ENCOUNTER — Other Ambulatory Visit: Payer: Self-pay | Admitting: Nurse Practitioner

## 2019-05-13 DIAGNOSIS — I2510 Atherosclerotic heart disease of native coronary artery without angina pectoris: Secondary | ICD-10-CM

## 2019-05-13 DIAGNOSIS — K219 Gastro-esophageal reflux disease without esophagitis: Secondary | ICD-10-CM

## 2019-05-13 DIAGNOSIS — I10 Essential (primary) hypertension: Secondary | ICD-10-CM

## 2019-05-13 MED ORDER — AMLODIPINE BESYLATE 10 MG PO TABS: 10.0000 mg | ORAL_TABLET | Freq: Every day | ORAL | 0 refills | Status: DC

## 2019-05-13 MED ORDER — TORSEMIDE 100 MG PO TABS: 100.0000 mg | ORAL_TABLET | Freq: Every day | ORAL | 0 refills | Status: DC

## 2019-05-13 MED ORDER — DEXILANT 60 MG PO CPDR: 1.0000 | DELAYED_RELEASE_CAPSULE | Freq: Every day | ORAL | 0 refills | Status: DC

## 2019-05-13 NOTE — Telephone Encounter (Signed)
Pt. Called requesting refill on torsemide, amlodipine,Dexilant  Called into [place] [place] [facility] [place]

## 2019-06-08 DIAGNOSIS — J449 Chronic obstructive pulmonary disease, unspecified: Secondary | ICD-10-CM | POA: Diagnosis not present

## 2019-06-25 ENCOUNTER — Other Ambulatory Visit: Payer: Self-pay | Admitting: Nurse Practitioner

## 2019-06-25 DIAGNOSIS — E782 Mixed hyperlipidemia: Secondary | ICD-10-CM

## 2019-07-08 ENCOUNTER — Other Ambulatory Visit: Payer: Self-pay

## 2019-07-08 ENCOUNTER — Encounter: Payer: Self-pay | Admitting: Cardiovascular Disease

## 2019-07-08 ENCOUNTER — Ambulatory Visit (INDEPENDENT_AMBULATORY_CARE_PROVIDER_SITE_OTHER): Payer: Medicare Other | Admitting: Cardiovascular Disease

## 2019-07-08 VITALS — BP 118/60 | HR 73 | Ht 65.0 in | Wt 275.5 lb

## 2019-07-08 DIAGNOSIS — E782 Mixed hyperlipidemia: Secondary | ICD-10-CM

## 2019-07-08 DIAGNOSIS — I10 Essential (primary) hypertension: Secondary | ICD-10-CM

## 2019-07-08 DIAGNOSIS — I2510 Atherosclerotic heart disease of native coronary artery without angina pectoris: Secondary | ICD-10-CM | POA: Diagnosis not present

## 2019-07-08 DIAGNOSIS — I5032 Chronic diastolic (congestive) heart failure: Secondary | ICD-10-CM

## 2019-07-08 MED ORDER — AMLODIPINE BESYLATE 5 MG PO TABS: 5.0000 mg | ORAL_TABLET | Freq: Every day | ORAL | 2 refills | Status: DC

## 2019-07-08 MED ORDER — CARVEDILOL 3.125 MG PO TABS: 3.1250 mg | ORAL_TABLET | Freq: Two times a day (BID) | ORAL | 2 refills | Status: DC

## 2019-07-08 NOTE — Patient Instructions (Signed)
Medication Instructions:  Your physician has recommended you make the following change in your medication:   REDUCE Amlodipine to 5mg  daily.  Carvedilol has been refilled.  If you need a refill on your cardiac medications before your next appointment, please call your pharmacy.   Lab work: None ordered If you have labs (blood work) drawn today and your tests are completely normal, you will receive your results only by: Marland Kitchen MyChart Message (if you have MyChart) OR . A paper copy in the mail If you have any lab test that is abnormal or we need to change your treatment, we will call you to review the results.  Testing/Procedures: None ordered  Follow-Up: At [facility], you and your health needs are our priority.  As part of our continuing mission to provide you with exceptional heart care, we have created designated Provider Care Teams.  These Care Teams include your primary Cardiologist (physician) and Advanced Practice Providers (APPs -  Physician Assistants and Nurse Practitioners) who all work together to provide you with the care you need, when you need it. You will need a follow up appointment in 6 months.  Please call our office 2 months in advance to schedule this appointment.  You may see Dr. Fletcher Anon  or one of the following Advanced Practice Providers on your designated Care Team:   Murray Hodgkins, NP Christell Faith, PA-C . Marrianne Mood, PA-C  Any Other Special Instructions Will Be Listed Below (If Applicable). N/A

## 2019-07-08 NOTE — Progress Notes (Signed)
Cardiology Office Note   Date:  07/08/2019   ID:  Jennife, Zaucha February 16, 1935, MRN [id number]  PCP:  Mikey College, NP  Cardiologist:  Kathlyn Sacramento, MD   Chief Complaint  Patient presents with  . other    6 month f/u c/o chest discomfort and sob. Meds reviewed verbally with pt.      History of Present Illness: Samantha Cooper is a 84 y.o. female who is here today for follow-up visit regarding coronary artery disease.   She has known history of coronary artery disease status post bare-metal stent placement to the LAD in May 2011, hyperlipidemia, essential hypertension, arthritis, chronic leg edema likely due to chronic venous insufficiency and obesity.    She had an echocardiogram done in September 2019 which showed an EF of 55 to 81%, grade 1 diastolic dysfunction, mild mitral regurgitation and mildly dilated left atrium.  Pulmonary pressure could not be estimated.  Previous cardiac catheterization in 2011 by Dr. Clayborn Bigness showed chronically occluded mid left circumflex with collaterals from the right coronary artery, moderate mid RCA disease, moderate proximal LAD disease and severe mid LAD stenosis.  The mid LAD stenosis was treated successfully with PCI and bare-metal stent placement.  Ejection fraction was normal. She had chest pain during last visit that was felt to be due to GERD.  She reports improvement in symptoms with PPI.  She has been doing well overall with no exertional symptoms.  She ran out of carvedilol few weeks ago.  Her blood pressure appears controlled even without this medication.   Past Medical History:  Diagnosis Date  . Heart disease   . History of heart attack   . Hyperlipidemia   . Osteoarthritis 09/26/2017  . Oxygen deficiency     Past Surgical History:  Procedure Laterality Date  . TUBAL LIGATION       Current Outpatient Medications  Medication Sig Dispense Refill  . amLODipine (NORVASC) 10 MG tablet Take 1 tablet (10 mg  total) by mouth daily. 90 tablet 0  . aspirin 81 MG tablet aspirin 81 mg tablet,delayed release  take 1 tablet by mouth once daily    . atorvastatin (LIPITOR) 40 MG tablet TAKE 1 TABLET(40 MG) BY MOUTH DAILY 90 tablet 0  . Blood Pressure Monitoring (BLOOD PRESSURE CUFF) MISC 1 Device by Does not apply route daily. 1 each 0  . clopidogrel (PLAVIX) 75 MG tablet Take 1 tablet (75 mg total) by mouth daily. 90 tablet 1  . dexlansoprazole (DEXILANT) 60 MG capsule Take 1 capsule (60 mg total) by mouth daily. 90 capsule 0  . lidocaine (XYLOCAINE) 5 % ointment Apply 1 application topically as needed. 35.44 g 0  . losartan (COZAAR) 25 MG tablet Take 1 tablet (25 mg total) by mouth daily. 90 tablet 1  . torsemide (DEMADEX) 100 MG tablet Take 1 tablet (100 mg total) by mouth daily. 90 tablet 0  . carvedilol (COREG) 3.125 MG tablet Take 1 tablet (3.125 mg total) by mouth 2 (two) times daily with a meal. (Patient not taking: Reported on 07/08/2019) 180 tablet 1  . famotidine (PEPCID) 20 MG tablet Take 1 tablet (20 mg total) by mouth 2 (two) times daily for 14 days. 28 tablet 0   No current facility-administered medications for this visit.     Allergies:   Codeine    Social History:  The patient  reports that she has quit smoking. She has a 15.00 pack-year smoking history. She has quit using  smokeless tobacco.  Her smokeless tobacco use included snuff. She reports that she does not drink alcohol or use drugs.   Family History:  The patient's family history includes Heart disease in her mother.    ROS:  Please see the history of present illness.   Otherwise, review of systems are positive for none.   All other systems are reviewed and negative.    PHYSICAL EXAM: VS:  BP 118/60 (BP Location: Right Arm, Patient Position: Sitting, Cuff Size: Large)   Pulse 73   Ht 5\' 5"  (1.651 m)   Wt 275 lb 8 oz (125 kg)   SpO2 96%   BMI 45.85 kg/m  , BMI Body mass index is 45.85 kg/m. GEN: Well nourished, well  developed, in no acute distress  HEENT: normal  Neck: no JVD, carotid bruits, or masses Cardiac: RRR; no murmurs, rubs, or gallops, edema bilaterally lower extremities Respiratory:  clear to auscultation bilaterally, normal work of breathing GI: soft, nontender, nondistended, + BS MS: no deformity or atrophy  Skin: warm and dry, no rash Neuro:  Strength and sensation are intact Psych: euthymic mood, full affect   EKG:  EKG is ordered today. The ekg ordered today demonstrates normal sinus rhythm with nonspecific T wave changes.   Recent Labs: 01/11/2019: ALT 4; BUN 16; Creat 0.59; Hemoglobin 13.4; Platelets 228; Potassium 4.1; Sodium 142    Lipid Panel    Component Value Date/Time   CHOL 162 01/11/2019 1016   CHOL 157 05/04/2012 0311   TRIG 88 01/11/2019 1016   TRIG 93 05/04/2012 0311   HDL 52 01/11/2019 1016   HDL 58 05/04/2012 0311   CHOLHDL 3.1 01/11/2019 1016   VLDL 19 05/04/2012 0311   LDLCALC 92 01/11/2019 1016   LDLCALC 80 05/04/2012 0311      Wt Readings from Last 3 Encounters:  07/08/19 275 lb 8 oz (125 kg)  01/11/19 264 lb 9.6 oz (120 kg)  01/07/19 266 lb 4 oz (120.8 kg)      PAD Screen 09/17/2018  Previous PAD dx? No  Previous surgical procedure? No  Pain with walking? Yes  Subsides with rest? Yes  Feet/toe relief with dangling? No  Painful, non-healing ulcers? No  Extremities discolored? No      ASSESSMENT AND PLAN:  1.  Coronary artery disease involving native coronary arteries without angina: The patient had bare-metal stent placement to the mid LAD in 2011 with no recurrent ischemic events.  Continue medical therapy.   She continues to be on dual antiplatelet therapy but we could consider stopping Plavix in the near future.  2.  Chronic diastolic heart failure: She appears to be euvolemic on current dose of torsemide.  Is not entirely clear to me why she requires such a high dose in spite of having normal renal function.  3.  Essential  hypertension: Blood pressure is controlled and she ran out of carvedilol few weeks ago.  Given chronic leg edema, I elected to decrease amlodipine to 5 mg once daily and hopefully we can discontinue this medication altogether and uptitrate carvedilol or losartan in the future.  4.  Hyperlipidemia: Continue atorvastatin 40 mg once daily.  Most recent lipid profile showed an LDL of 92.    Disposition:   FU with me in 6 months    Signed, Lorine BearsMuhammad , MD 07/08/19 [facility] [place] [phone]

## 2019-07-09 DIAGNOSIS — J449 Chronic obstructive pulmonary disease, unspecified: Secondary | ICD-10-CM | POA: Diagnosis not present

## 2019-07-14 ENCOUNTER — Encounter: Payer: Self-pay | Admitting: Nurse Practitioner

## 2019-07-14 ENCOUNTER — Other Ambulatory Visit: Payer: Self-pay

## 2019-07-14 ENCOUNTER — Ambulatory Visit: Payer: Medicare Other | Admitting: Nurse Practitioner

## 2019-07-14 ENCOUNTER — Ambulatory Visit (INDEPENDENT_AMBULATORY_CARE_PROVIDER_SITE_OTHER): Payer: Medicare Other | Admitting: Nurse Practitioner

## 2019-07-14 VITALS — BP 123/51 | HR 72 | Ht 65.0 in | Wt 272.0 lb

## 2019-07-14 DIAGNOSIS — R7309 Other abnormal glucose: Secondary | ICD-10-CM

## 2019-07-14 DIAGNOSIS — K219 Gastro-esophageal reflux disease without esophagitis: Secondary | ICD-10-CM

## 2019-07-14 DIAGNOSIS — Z23 Encounter for immunization: Secondary | ICD-10-CM | POA: Diagnosis not present

## 2019-07-14 DIAGNOSIS — E782 Mixed hyperlipidemia: Secondary | ICD-10-CM

## 2019-07-14 DIAGNOSIS — I10 Essential (primary) hypertension: Secondary | ICD-10-CM

## 2019-07-14 DIAGNOSIS — L729 Follicular cyst of the skin and subcutaneous tissue, unspecified: Secondary | ICD-10-CM

## 2019-07-14 DIAGNOSIS — E662 Morbid (severe) obesity with alveolar hypoventilation: Secondary | ICD-10-CM

## 2019-07-14 DIAGNOSIS — I2510 Atherosclerotic heart disease of native coronary artery without angina pectoris: Secondary | ICD-10-CM

## 2019-07-14 NOTE — Progress Notes (Signed)
Subjective:    Patient ID: Samantha Cooper, female    DOB: 1935-03-24, 84 y.o.   MRN: [id number]  Samantha Cooper is a 84 y.o. female presenting on 07/14/2019 for Hypertension   HPI Hypertension - She is not checking BP at home or outside of clinic.    - Current medications: amlodipine 5 mg once daily, carvedilol 3.125 mg bid, losartan 25 mg daily, torsemide 100 mg daily tolerating well without side effects - She is not currently symptomatic. - Pt denies headache, lightheadedness, dizziness, changes in vision, chest tightness/pressure, palpitations, changes in leg swelling, sudden loss of speech or loss of consciousness. - She  reports no regular exercise routine. - Her diet is moderate in salt, moderate in fat, and moderate in carbohydrates.   GERD Patient is currently taking Dexilant 60 mg once daily, tolerating well.  Has no regular breakthrough heartburn.  She will have heartburn if she eats large meal close to bed OR trigger foods. - She reports no n/v, coffee ground emesis, dark/black/tarry stool, BRBPR, or other GI bleeding.   Elevated glucose Patient has had no changes to indicate worsening of glucose readings.  She is not checking CBGs at home. - She is not currently symptomatic and denies polydipsia, polyphagia, polyuria, headaches, diaphoresis, shakiness, chills, pain, numbness or tingling in extremities and changes in vision.    Recent Labs    01/11/19 1016 07/19/19 0905  HGBA1C 6.1* 6.2*     Morbid Obesity Patient continues to eat healthy diet, work toward improving overall health.  Has goal for weight loss to become a candidate for knee replacement.  States she remains motivated, but is limited with activity due to knee pain.  HYPERLIPIDEMIA, CAD Patient is currently taking atorvastatin 40 mg once daily.  She is tolerating well without myalgias.   Pt denies changes in vision, chest tightness/pressure, palpitations, shortness of breath, leg pain while walking,  leg or arm weakness, and sudden loss of speech or loss of consciousness.   Hypoventilation Uses Oxygen 2L at bedtime.  Occasionally uses it during day if she continues being short of breath after resting.    Subcutaneous Cyst Continues to have pain on lower right leg due to cyst.  Pain is worsening and is now burning pain most of the time.  Patient desires treatment for removal.  Social History   Tobacco Use  . Smoking status: Former Smoker    Packs/day: 1.50    Years: 10.00    Pack years: 15.00  . Smokeless tobacco: Former NeurosurgeonUser    Types: Snuff  . Tobacco comment: unsure when she quit, over 30 years ago   Substance Use Topics  . Alcohol use: No    Frequency: Never  . Drug use: No    Review of Systems Per HPI unless specifically indicated above     Objective:    BP (!) 123/51 (BP Location: Right Arm, Patient Position: Sitting, Cuff Size: Large)   Pulse 72   Ht 5\' 5"  (1.651 m)   SpO2 96%   BMI 45.85 kg/m    Wt Readings from Last 3 Encounters:  07/08/19 275 lb 8 oz (125 kg)  01/11/19 264 lb 9.6 oz (120 kg)  01/07/19 266 lb 4 oz (120.8 kg)    Physical Exam Vitals signs reviewed.  Constitutional:      General: She is awake. She is not in acute distress.    Appearance: She is well-developed. She is obese. She is not ill-appearing.  HENT:  Head: Normocephalic and atraumatic.  Neck:     Musculoskeletal: Normal range of motion and neck supple.     Vascular: No carotid bruit.  Cardiovascular:     Rate and Rhythm: Normal rate and regular rhythm.     Pulses:          Radial pulses are 2+ on the right side and 2+ on the left side.       Posterior tibial pulses are 1+ on the right side and 1+ on the left side.     Heart sounds: Normal heart sounds, S1 normal and S2 normal.  Pulmonary:     Effort: Pulmonary effort is normal. No respiratory distress.     Breath sounds: Normal breath sounds and air entry.  Abdominal:     General: Bowel sounds are normal. There is no  distension.     Palpations: Abdomen is soft.     Tenderness: There is no abdominal tenderness.     Hernia: No hernia is present.  Musculoskeletal:     Right lower leg: 2+ Pitting Edema present.     Left lower leg: 2+ Pitting Edema present.  Skin:    General: Skin is warm and dry.       Neurological:     Mental Status: She is alert and oriented to person, place, and time.  Psychiatric:        Attention and Perception: Attention normal.        Mood and Affect: Mood and affect normal.        Behavior: Behavior normal. Behavior is cooperative.    Results for orders placed or performed in visit on 01/11/19  COMPLETE METABOLIC PANEL WITH GFR  Result Value Ref Range   Glucose, Bld 98 65 - 99 mg/dL   BUN 16 7 - 25 mg/dL   Creat 1.610.59 (L) 0.960.60 - 0.88 mg/dL   GFR, Est Non African American 86 > OR = 60 mL/min/1.3273m2   GFR, Est African American 100 > OR = 60 mL/min/1.2373m2   BUN/Creatinine Ratio 27 (H) 6 - 22 (calc)   Sodium 142 135 - 146 mmol/L   Potassium 4.1 3.5 - 5.3 mmol/L   Chloride 102 98 - 110 mmol/L   CO2 33 (H) 20 - 32 mmol/L   Calcium 9.2 8.6 - 10.4 mg/dL   Total Protein 6.8 6.1 - 8.1 g/dL   Albumin 3.7 3.6 - 5.1 g/dL   Globulin 3.1 1.9 - 3.7 g/dL (calc)   AG Ratio 1.2 1.0 - 2.5 (calc)   Total Bilirubin 0.5 0.2 - 1.2 mg/dL   Alkaline phosphatase (APISO) 107 37 - 153 U/L   AST 12 10 - 35 U/L   ALT 4 (L) 6 - 29 U/L  Lipid panel  Result Value Ref Range   Cholesterol 162 <200 mg/dL   HDL 52 > OR = 50 mg/dL   Triglycerides 88 <045<150 mg/dL   LDL Cholesterol (Calc) 92 mg/dL (calc)   Total CHOL/HDL Ratio 3.1 <5.0 (calc)   Non-HDL Cholesterol (Calc) 110 <130 mg/dL (calc)  Hemoglobin W0JA1c  Result Value Ref Range   Hgb A1c MFr Bld 6.1 (H) <5.7 % of total Hgb   Mean Plasma Glucose 128 (calc)   eAG (mmol/L) 7.1 (calc)  CBC with Differential/Platelet  Result Value Ref Range   WBC 7.1 3.8 - 10.8 Thousand/uL   RBC 4.71 3.80 - 5.10 Million/uL   Hemoglobin 13.4 11.7 - 15.5 g/dL    HCT 81.142.1 91.435.0 - 78.245.0 %  MCV 89.4 80.0 - 100.0 fL   MCH 28.5 27.0 - 33.0 pg   MCHC 31.8 (L) 32.0 - 36.0 g/dL   RDW 12.7 11.0 - 15.0 %   Platelets 228 140 - 400 Thousand/uL   MPV 10.7 7.5 - 12.5 fL   Neutro Abs 4,189 1,500 - 7,800 cells/uL   Lymphs Abs 2,371 850 - 3,900 cells/uL   Absolute Monocytes 447 200 - 950 cells/uL   Eosinophils Absolute 71 15 - 500 cells/uL   Basophils Absolute 21 0 - 200 cells/uL   Neutrophils Relative % 59 %   Total Lymphocyte 33.4 %   Monocytes Relative 6.3 %   Eosinophils Relative 1.0 %   Basophils Relative 0.3 %      Assessment & Plan:   Problem List Items Addressed This Visit      Cardiovascular and Mediastinum   Hypertension Controlled hypertension with occasional symptomatic hypotension, recently reduced amlodipine with cardiology.  Good BP today with reduced dose.  BP goal < 130/80.  Pt is working on lifestyle modifications.  Taking medications tolerating well without side effects.   Plan: 1. Continue taking medications without changes 2. Obtain labs today  3. Encouraged heart healthy diet and increasing exercise to 30 minutes most days of the week. 4. Check BP 1-2 x per week at home, keep log, and bring to clinic at next appointment. 5. Follow up 3 months and with Cardiology as requested.  After next visit with me, may be able to extend to 6 month followup, so we can get interval check ins about every 3 months between PCP and cardiology.   Relevant Medications   atorvastatin (LIPITOR) 40 MG tablet   losartan (COZAAR) 25 MG tablet   torsemide (DEMADEX) 100 MG tablet   Other Relevant Orders   Comprehensive metabolic panel (Completed)   Coronary artery disease involving native coronary artery of native heart without angina pectoris See HYPERLIPIDEMIA, Continue with cardiology management.   Relevant Medications   atorvastatin (LIPITOR) 40 MG tablet   clopidogrel (PLAVIX) 75 MG tablet   losartan (COZAAR) 25 MG tablet   torsemide (DEMADEX) 100  MG tablet     Respiratory   Hypoxia Obesity hypoventilation syndrome Stable today on exam.  Medications not required, but pt is using 2L O2 at bedtime w good results.  Continue at current doses.   .   . Followup with pulmonology as requested and here once annually.      Digestive   GERD (gastroesophageal reflux disease) Currently well controlled on dexilant 60 mg once daily.  Plan: 1. Continue dexilant 60 mg once daily. Side effects discussed. Pt wants to continue med. 2. Avoid diet triggers. Reviewed need to seek care if globus sensation, difficulty swallowing, s/sx of GI bleed. 3. Follow up as needed and in 6 months.    Relevant Medications   dexlansoprazole (DEXILANT) 60 MG capsule   Other Relevant Orders   Comprehensive metabolic panel (Completed)     Other   Hyperlipidemia - Primary Known CAD without any interval events.  Patient has had past MI, resolved angina currently.  Unknown control of hyperlipidemia without labs.  Currently taking atorvastatin.     Plan: 1. Continue atorvastatin 40 mg once daily 2. Needs labs today - patient fasting 3. Continue low glycemic diet - reinforced prior education 4. Follow-up with labs in about 6 months.    Relevant Medications   atorvastatin (LIPITOR) 40 MG tablet   losartan (COZAAR) 25 MG tablet   torsemide (DEMADEX) 100  MG tablet   Other Relevant Orders   Lipid panel (Completed)   Comprehensive metabolic panel (Completed)   Elevated glucose Status unknown.  Recheck labs.  Continue meds without changes today.  Refills provided. Followup 6 months and sooner prn after labs.    Relevant Orders   Comprehensive metabolic panel (Completed)   Hemoglobin A1c (Completed)    Other Visit Diagnoses    Flu vaccine need       Relevant Orders   Flu Vaccine QUAD High Dose(Fluad) (Completed)   Subcutaneous cyst     Stable subcutaneous cyst, but with pain bothersome to patient. - Referral to dermatology for consult and possible removal. -  Follow-up prn.   Relevant Orders   Ambulatory referral to Dermatology      Meds ordered this encounter  Medications  . atorvastatin (LIPITOR) 40 MG tablet    Sig: TAKE 1 TABLET(40 MG) BY MOUTH DAILY    Dispense:  90 tablet    Refill:  1    Order Specific Question:   Supervising Provider    Answer:   Smitty Cords [[id number]]  . clopidogrel (PLAVIX) 75 MG tablet    Sig: Take 1 tablet (75 mg total) by mouth daily.    Dispense:  90 tablet    Refill:  1    Order Specific Question:   Supervising Provider    Answer:   Smitty Cords [[id number]]  . dexlansoprazole (DEXILANT) 60 MG capsule    Sig: Take 1 capsule (60 mg total) by mouth daily.    Dispense:  90 capsule    Refill:  1    Order Specific Question:   Supervising Provider    Answer:   Smitty Cords [[id number]]  . losartan (COZAAR) 25 MG tablet    Sig: Take 1 tablet (25 mg total) by mouth daily.    Dispense:  90 tablet    Refill:  1    Order Specific Question:   Supervising Provider    Answer:   Smitty Cords [[id number]]  . torsemide (DEMADEX) 100 MG tablet    Sig: Take 1 tablet (100 mg total) by mouth daily.    Dispense:  90 tablet    Refill:  1    Order Specific Question:   Supervising Provider    Answer:   Smitty Cords [[id number]]   Follow up plan: Return in about 3 months (around 10/13/2019) for hypertension, cholesterol.  Wilhelmina Mcardle, DNP, AGPCNP-BC Adult Gerontology Primary Care Nurse Practitioner [facility] 07/14/2019, 2:26 PM

## 2019-07-14 NOTE — Patient Instructions (Addendum)
Samantha Cooper,   Thank you for coming in to clinic today.  1. Continue with medication changes as planned by Cardiology - Reduce amlodipine - you are doing well with this decrease so far.  2. DERMATOLOGY [facility]  [address], [place], [place] [address] Phone: [phone]  They will call you to schedule.  This is to remove your cyst that is bothering you.   3. You will be due for FASTING BLOOD WORK.  This means you should eat no food or drink after midnight.  Drink only water or coffee without cream/sugar on the morning of your lab visit. - Please go ahead and schedule a "Lab Only" visit in the morning at the clinic for lab draw in the next 7 days. - Your results will be available about 2-3 days after blood draw.  If you have set up a MyChart account, you can can log in to MyChart online to view your results and a brief explanation. Also, we can discuss your results together at your next office visit if you would like.  Please schedule a follow-up appointment with [name], AGNP. Return in about 3 months (around 10/13/2019) for hypertension, cholesterol.  If you have any other questions or concerns, please feel free to call the clinic or send a message through [place]. You may also schedule an earlier appointment if necessary.  You will receive a survey after today's visit either digitally by e-mail or paper by [employer]. Your experiences and feedback matter to [place].  Please respond so we know how we are doing as we provide care for you.   [name], DNP, AGNP-BC Adult Gerontology Nurse Practitioner [place]

## 2019-07-19 ENCOUNTER — Other Ambulatory Visit: Payer: Medicare Other

## 2019-07-19 ENCOUNTER — Other Ambulatory Visit: Payer: Self-pay

## 2019-07-19 DIAGNOSIS — I10 Essential (primary) hypertension: Secondary | ICD-10-CM | POA: Diagnosis not present

## 2019-07-19 DIAGNOSIS — E782 Mixed hyperlipidemia: Secondary | ICD-10-CM | POA: Diagnosis not present

## 2019-07-19 DIAGNOSIS — K219 Gastro-esophageal reflux disease without esophagitis: Secondary | ICD-10-CM | POA: Diagnosis not present

## 2019-07-19 DIAGNOSIS — R7309 Other abnormal glucose: Secondary | ICD-10-CM | POA: Diagnosis not present

## 2019-07-20 LAB — COMPREHENSIVE METABOLIC PANEL
AG Ratio: 1.2 (calc) (ref 1.0–2.5)
ALT: 3 U/L — ABNORMAL LOW (ref 6–29)
AST: 10 U/L (ref 10–35)
Albumin: 3.6 g/dL (ref 3.6–5.1)
Alkaline phosphatase (APISO): 106 U/L (ref 37–153)
BUN: 15 mg/dL (ref 7–25)
CO2: 33 mmol/L — ABNORMAL HIGH (ref 20–32)
Calcium: 9 mg/dL (ref 8.6–10.4)
Chloride: 102 mmol/L (ref 98–110)
Creat: 0.71 mg/dL (ref 0.60–0.88)
Globulin: 3.1 g/dL (calc) (ref 1.9–3.7)
Glucose, Bld: 112 mg/dL — ABNORMAL HIGH (ref 65–99)
Potassium: 4.2 mmol/L (ref 3.5–5.3)
Sodium: 143 mmol/L (ref 135–146)
Total Bilirubin: 0.5 mg/dL (ref 0.2–1.2)
Total Protein: 6.7 g/dL (ref 6.1–8.1)

## 2019-07-20 LAB — LIPID PANEL
Cholesterol: 142 mg/dL (ref <200)
HDL: 51 mg/dL (ref >=50)
LDL Cholesterol (Calc): 72 mg/dL (calc)
Non-HDL Cholesterol (Calc): 91 mg/dL (calc) (ref <130)
Total CHOL/HDL Ratio: 2.8 (calc) (ref <5.0)
Triglycerides: 107 mg/dL (ref <150)

## 2019-07-20 LAB — HEMOGLOBIN A1C
Hgb A1c MFr Bld: 6.2 % of total Hgb — ABNORMAL HIGH (ref <5.7)
Mean Plasma Glucose: 131 (calc)
eAG (mmol/L): 7.3 (calc)

## 2019-07-21 ENCOUNTER — Encounter: Payer: Self-pay | Admitting: Nurse Practitioner

## 2019-07-21 DIAGNOSIS — I2510 Atherosclerotic heart disease of native coronary artery without angina pectoris: Secondary | ICD-10-CM | POA: Insufficient documentation

## 2019-07-21 MED ORDER — LOSARTAN POTASSIUM 25 MG PO TABS: 25.0000 mg | ORAL_TABLET | Freq: Every day | ORAL | 1 refills | Status: DC

## 2019-07-21 MED ORDER — TORSEMIDE 100 MG PO TABS: 100.0000 mg | ORAL_TABLET | Freq: Every day | ORAL | 1 refills | Status: DC

## 2019-07-21 MED ORDER — DEXILANT 60 MG PO CPDR: 1.0000 | DELAYED_RELEASE_CAPSULE | Freq: Every day | ORAL | 1 refills | Status: DC

## 2019-07-21 MED ORDER — ATORVASTATIN CALCIUM 40 MG PO TABS: ORAL_TABLET | ORAL | 1 refills | Status: DC

## 2019-07-21 MED ORDER — CLOPIDOGREL BISULFATE 75 MG PO TABS: 75.0000 mg | ORAL_TABLET | Freq: Every day | ORAL | 1 refills | Status: DC

## 2019-08-08 DIAGNOSIS — J449 Chronic obstructive pulmonary disease, unspecified: Secondary | ICD-10-CM | POA: Diagnosis not present

## 2019-09-08 DIAGNOSIS — J449 Chronic obstructive pulmonary disease, unspecified: Secondary | ICD-10-CM | POA: Diagnosis not present

## 2019-10-08 DIAGNOSIS — J449 Chronic obstructive pulmonary disease, unspecified: Secondary | ICD-10-CM | POA: Diagnosis not present

## 2019-10-14 ENCOUNTER — Ambulatory Visit: Payer: Medicare Other | Admitting: Nurse Practitioner

## 2019-10-18 ENCOUNTER — Other Ambulatory Visit: Payer: Self-pay | Admitting: Family Medicine

## 2019-10-18 ENCOUNTER — Other Ambulatory Visit: Payer: Self-pay | Admitting: Nurse Practitioner

## 2019-10-18 DIAGNOSIS — E782 Mixed hyperlipidemia: Secondary | ICD-10-CM

## 2019-10-18 DIAGNOSIS — I2510 Atherosclerotic heart disease of native coronary artery without angina pectoris: Secondary | ICD-10-CM

## 2019-10-18 DIAGNOSIS — I10 Essential (primary) hypertension: Secondary | ICD-10-CM

## 2019-10-25 ENCOUNTER — Ambulatory Visit: Payer: Medicare Other | Admitting: Family Medicine

## 2019-11-01 ENCOUNTER — Encounter: Payer: Self-pay | Admitting: Family Medicine

## 2019-11-01 ENCOUNTER — Other Ambulatory Visit: Payer: Self-pay

## 2019-11-01 ENCOUNTER — Ambulatory Visit (INDEPENDENT_AMBULATORY_CARE_PROVIDER_SITE_OTHER): Payer: Medicare Other | Admitting: Family Medicine

## 2019-11-01 VITALS — BP 128/60 | HR 70 | Temp 97.3°F | Resp 16 | Ht 65.5 in | Wt 271.6 lb

## 2019-11-01 DIAGNOSIS — I10 Essential (primary) hypertension: Secondary | ICD-10-CM

## 2019-11-01 DIAGNOSIS — E782 Mixed hyperlipidemia: Secondary | ICD-10-CM | POA: Diagnosis not present

## 2019-11-01 DIAGNOSIS — L729 Follicular cyst of the skin and subcutaneous tissue, unspecified: Secondary | ICD-10-CM

## 2019-11-01 NOTE — Progress Notes (Signed)
Subjective:    Patient ID: Samantha Cooper, female    DOB: 02-Dec-1934, 85 y.o.   MRN: [id number]  Samantha Cooper is a 85 y.o. female presenting on 11/01/2019 for Hypertension   HPI   CHRONIC HTN: Reports no new concerns. Checking BP at home without problem, normal readings. Current Meds - Amlodipine 5mg  daily, Carvedilol 3.125mg  BID, Losartan 25mg  daily, Torsemide 100mg  daily.   Reports good compliance, took meds today. Tolerating well, w/o complaints. Admits mild edema, but not worsening Denies CP, dyspnea, HA, dizziness / lightheadedness  HYPERLIPIDEMIA: - Reports no concerns. Last lipid panel 06/2019, controlled on statin - Currently taking Atoravstatin 40mg , tolerating well without side effects or myalgias  Right lower leg cyst Previous problem back in 06/2019, prior PCP , AGPCNP-BC referred her to Dermatology but patient was unable to schedule apt due to missing the phone call. She still has same problem request return to [facility]. Admits pain with cyst located in leg, worse at times episodic, no redness or drainage.  Depression screen [facility] 2/9 11/01/2019 01/05/2019 01/15/2018  Decreased Interest 0 0 0  Down, Depressed, Hopeless 0 0 0  PHQ - 2 Score 0 0 0    Social History   Tobacco Use  . Smoking status: Former Smoker    Packs/day: 1.50    Years: 10.00    Pack years: 15.00  . Smokeless tobacco: Former 4/9    Types: Snuff  . Tobacco comment: unsure when she quit, over 30 years ago   Substance Use Topics  . Alcohol use: No  . Drug use: No    Review of Systems Per HPI unless specifically indicated above     Objective:    BP 128/60   Pulse 70   Temp (!) 97.3 F (36.3 C) (Oral)   Resp 16   Ht 5' 5.5" (1.664 m)   Wt 271 lb 9.6 oz (123.2 kg)   BMI 44.51 kg/m   Wt Readings from Last 3 Encounters:  11/01/19 271 lb 9.6 oz (123.2 kg)  07/14/19 272 lb (123.4 kg)  07/08/19 275 lb 8 oz (125 kg)    Physical Exam Vitals and nursing note reviewed.    Constitutional:      General: She is not in acute distress.    Appearance: She is well-developed. She is not diaphoretic.     Comments: Well-appearing, comfortable, cooperative, obese  HENT:     Head: Normocephalic and atraumatic.  Eyes:     General:        Right eye: No discharge.        Left eye: No discharge.     Conjunctiva/sclera: Conjunctivae normal.  Neck:     Thyroid: No thyromegaly.  Cardiovascular:     Rate and Rhythm: Normal rate and regular rhythm.     Heart sounds: Normal heart sounds. No murmur.  Pulmonary:     Effort: Pulmonary effort is normal. No respiratory distress.     Breath sounds: Normal breath sounds. No wheezing or rales.  Musculoskeletal:        General: Normal range of motion.     Cervical back: Normal range of motion and neck supple.     Comments: Has walker  Lymphadenopathy:     Cervical: No cervical adenopathy.  Skin:    General: Skin is warm and dry.     Findings: No erythema or rash.     Comments: Anterior RLE 2 cm well defined soft mobile subcutaneus nodule vs cyst, mild tender  Neurological:     Mental Status: She is alert and oriented to person, place, and time.  Psychiatric:        Behavior: Behavior normal.     Comments: Well groomed, good eye contact, normal speech and thoughts       Results for orders placed or performed in visit on 07/14/19  Lipid panel  Result Value Ref Range   Cholesterol 142 <200 mg/dL   HDL 51 > OR = 50 mg/dL   Triglycerides 107 <150 mg/dL   LDL Cholesterol (Calc) 72 mg/dL (calc)   Total CHOL/HDL Ratio 2.8 <5.0 (calc)   Non-HDL Cholesterol (Calc) 91 <130 mg/dL (calc)  Comprehensive metabolic panel  Result Value Ref Range   Glucose, Bld 112 (H) 65 - 99 mg/dL   BUN 15 7 - 25 mg/dL   Creat 0.71 0.60 - 0.88 mg/dL   BUN/Creatinine Ratio NOT APPLICABLE 6 - 22 (calc)   Sodium 143 135 - 146 mmol/L   Potassium 4.2 3.5 - 5.3 mmol/L   Chloride 102 98 - 110 mmol/L   CO2 33 (H) 20 - 32 mmol/L   Calcium 9.0 8.6  - 10.4 mg/dL   Total Protein 6.7 6.1 - 8.1 g/dL   Albumin 3.6 3.6 - 5.1 g/dL   Globulin 3.1 1.9 - 3.7 g/dL (calc)   AG Ratio 1.2 1.0 - 2.5 (calc)   Total Bilirubin 0.5 0.2 - 1.2 mg/dL   Alkaline phosphatase (APISO) 106 37 - 153 U/L   AST 10 10 - 35 U/L   ALT 3 (L) 6 - 29 U/L  Hemoglobin A1c  Result Value Ref Range   Hgb A1c MFr Bld 6.2 (H) <5.7 % of total Hgb   Mean Plasma Glucose 131 (calc)   eAG (mmol/L) 7.3 (calc)      Assessment & Plan:   Problem List Items Addressed This Visit    Hypertension - Primary   Hyperlipidemia    Other Visit Diagnoses    Subcutaneous cyst       Relevant Orders   Ambulatory referral to Dermatology      HTN Controlled on current med regimen. No changes. Has refills Monitor BP at home Follow-up  HLD Controlled as of 06/2019 on statin Continue atorvastatin 40mg   Cyst, lower leg Reviewed last office visit note by [name], AGPCNP-BC from 06/2019 that documented this problem previously  Previous referral was unsuccessful at scheduling, will repeat referral, gave her the phone # to call to schedule as well. F/u with dermatology. Possibly can be related to edema and varicose vein that is painful however seems more well defined and cystic rather than vein on my exam  Referral placed for Dermatology     Orders Placed This Encounter  Procedures  . Ambulatory referral to Dermatology    Referral Priority:   Routine    Referral Type:   Consultation    Referral Reason:   Specialty Services Required    Requested Specialty:   Dermatology    Number of Visits Requested:   1    No orders of the defined types were placed in this encounter.     Follow up plan: Return in about 3 months (around 01/30/2020), or if symptoms worsen or fail to improve, for HTN, HLD w/ new provider.   Nobie Putnam, DO [facility] 11/01/2019, 9:44 AM

## 2019-11-01 NOTE — Patient Instructions (Addendum)
Thank you for coming to the office today.  Stay tuned for phone call for apt. If you do not hear back in 1 week, then you can call them.  [facility] [place]ermatologist [place], [place] Address: [address], [place], [place] [address] Phone: [phone]  Please schedule a Follow-up Appointment to: Return in about 3 months (around 01/30/2020), or if symptoms worsen or fail to improve, for HTN, HLD w/ new provider.  If you have any other questions or concerns, please feel free to call the office or send a message through MyChart. You may also schedule an earlier appointment if necessary.  Additionally, you may be receiving a survey about your experience at our office within a few days to 1 week by e-mail or mail. We value your feedback.  Saralyn Pilar, DO [facility], [place]

## 2019-11-08 DIAGNOSIS — J449 Chronic obstructive pulmonary disease, unspecified: Secondary | ICD-10-CM | POA: Diagnosis not present

## 2019-11-17 ENCOUNTER — Telehealth: Payer: Self-pay | Admitting: Nurse Practitioner

## 2019-11-17 NOTE — Chronic Care Management (AMB) (Signed)
  Chronic Care Management   Outreach Note  11/17/2019 Name: Samantha Cooper MRN: [id number] DOB: 07-25-1935  Samantha Cooper is a 85 y.o. year old female who is a primary care patient of Galen Manila, NP (Inactive). I reached out to [name] by phone today in response to a referral sent by Ms. Naseem J Fohl's health plan.     An unsuccessful telephone outreach was attempted today. The patient was referred to the case management team by for assistance with care management and care coordination.   Follow Up Plan: A HIPPA compliant phone message was left for the patient providing contact information and requesting a return call.  The care management team will reach out to the patient again over the next 7 days.  If patient returns call to provider office, please advise to call Embedded Care Management Care Guide Penne Lash at [phone]  Penne Lash, RMA Care Guide, Embedded Care Coordination [facility]  [place], [place] [address] Direct Dial: [phone] [email or web address] Website: [email or web address]

## 2019-11-23 NOTE — Chronic Care Management (AMB) (Signed)
°  Chronic Care Management   Outreach Note  11/23/2019 Name: Samantha Cooper MRN: [id number] DOB: June 16, 1935  [name] [name] is a 85 y.o. year old female who is a primary care patient of Galen Manila, NP (Inactive). I reached out to [name] by phone today in response to a referral sent by [name]'s health plan.     A second unsuccessful telephone outreach was attempted today. The patient was referred to the case management team for assistance with care management and care coordination.   Follow Up Plan: The care management team will reach out to the patient again over the next 7 days.   Penne Lash, RMA Care Guide, Embedded Care Coordination [facility]  [place], [place] [address] Direct Dial: [phone] [email or web address]: [email or web address]

## 2019-11-30 NOTE — Chronic Care Management (AMB) (Signed)
°  Chronic Care Management   Outreach Note  11/30/2019 Name: Samantha Cooper MRN: [id number] DOB: [birth year]  Samantha Cooper is a 85 y.o. year old female who is a primary care patient of Galen Manila, NP (Inactive). I reached out to [name] by phone today in response to a referral sent by [name]'s health plan.     Third unsuccessful telephone outreach was attempted today. The patient was referred to the case management team for assistance with care management and care coordination. The patient's primary care provider has been notified of our unsuccessful attempts to make or maintain contact with the patient. The care management team is pleased to engage with this patient at any time in the future should he/she be interested in assistance from the care management team.   Follow Up Plan: The care management team is available to follow up with the patient after provider conversation with the patient regarding recommendation for care management engagement and subsequent re-referral to the care management team.   Penne Lash, RMA Care Guide, Embedded Care Coordination [facility]  [place], [place] [address] Direct Dial: [phone] [email or web address] Website: [email or web address]

## 2019-12-09 DIAGNOSIS — J449 Chronic obstructive pulmonary disease, unspecified: Secondary | ICD-10-CM | POA: Diagnosis not present

## 2020-01-06 DIAGNOSIS — J449 Chronic obstructive pulmonary disease, unspecified: Secondary | ICD-10-CM | POA: Diagnosis not present

## 2020-02-02 ENCOUNTER — Ambulatory Visit: Payer: Medicare Other | Admitting: Family Medicine

## 2020-02-06 DIAGNOSIS — J449 Chronic obstructive pulmonary disease, unspecified: Secondary | ICD-10-CM | POA: Diagnosis not present

## 2020-02-17 ENCOUNTER — Ambulatory Visit (INDEPENDENT_AMBULATORY_CARE_PROVIDER_SITE_OTHER): Payer: Medicare Other | Admitting: Cardiovascular Disease

## 2020-02-17 ENCOUNTER — Encounter: Payer: Self-pay | Admitting: Cardiovascular Disease

## 2020-02-17 ENCOUNTER — Other Ambulatory Visit: Payer: Self-pay

## 2020-02-17 VITALS — BP 126/50 | HR 67 | Ht 64.0 in | Wt 276.5 lb

## 2020-02-17 DIAGNOSIS — E782 Mixed hyperlipidemia: Secondary | ICD-10-CM | POA: Diagnosis not present

## 2020-02-17 DIAGNOSIS — I10 Essential (primary) hypertension: Secondary | ICD-10-CM | POA: Diagnosis not present

## 2020-02-17 DIAGNOSIS — I2510 Atherosclerotic heart disease of native coronary artery without angina pectoris: Secondary | ICD-10-CM | POA: Diagnosis not present

## 2020-02-17 DIAGNOSIS — I5032 Chronic diastolic (congestive) heart failure: Secondary | ICD-10-CM

## 2020-02-17 MED ORDER — LOSARTAN POTASSIUM 50 MG PO TABS: 50.0000 mg | ORAL_TABLET | Freq: Every day | ORAL | 2 refills | Status: DC

## 2020-02-17 NOTE — Progress Notes (Signed)
Cardiology Office Note   Date:  02/17/2020   ID:  Samantha Cooper, Samantha Cooper August 31, 1935, MRN [id number]  PCP:  Galen Manila, NP (Inactive)  Cardiologist:  Lorine Bears, MD   Chief Complaint  Patient presents with  . OTHER    6 month f/u no complaints today. Meds reviewed verbally with pt.      History of Present Illness: Samantha Cooper is a 85 y.o. female who is here today for follow-up visit regarding coronary artery disease.   She has known history of coronary artery disease status post bare-metal stent placement to the LAD in May 2011, hyperlipidemia, essential hypertension, arthritis, chronic leg edema likely due to chronic venous insufficiency and obesity.    She had an echocardiogram done in September 2019 which showed an EF of 55 to 60%, grade 1 diastolic dysfunction, mild mitral regurgitation and mildly dilated left atrium.  Pulmonary pressure could not be estimated.  Previous cardiac catheterization in 2011 by Dr. Juliann Pares showed chronically occluded mid left circumflex with collaterals from the right coronary artery, moderate mid RCA disease, moderate proximal LAD disease and severe mid LAD stenosis.  The mid LAD stenosis was treated successfully with PCI and bare-metal stent placement.  Ejection fraction was normal.  She has been doing well with no recent chest pain or palpitations, she reports chronic exertional dyspnea and leg edema.  She takes her medications regularly.   Past Medical History:  Diagnosis Date  . Heart disease   . History of heart attack   . Hyperlipidemia   . Osteoarthritis 09/26/2017  . Oxygen deficiency     Past Surgical History:  Procedure Laterality Date  . TUBAL LIGATION       Current Outpatient Medications  Medication Sig Dispense Refill  . aspirin 81 MG tablet aspirin 81 mg tablet,delayed release  take 1 tablet by mouth once daily    . atorvastatin (LIPITOR) 40 MG tablet TAKE 1 TABLET(40 MG) BY MOUTH DAILY 90 tablet 1    . Blood Pressure Monitoring (BLOOD PRESSURE CUFF) MISC 1 Device by Does not apply route daily. 1 each 0  . carvedilol (COREG) 3.125 MG tablet Take 1 tablet (3.125 mg total) by mouth 2 (two) times daily with a meal. 180 tablet 2  . dexlansoprazole (DEXILANT) 60 MG capsule Take 1 capsule (60 mg total) by mouth daily. 90 capsule 1  . lidocaine (XYLOCAINE) 5 % ointment Apply 1 application topically as needed. 35.44 g 0  . losartan (COZAAR) 50 MG tablet Take 1 tablet (50 mg total) by mouth daily. 90 tablet 2  . torsemide (DEMADEX) 100 MG tablet TAKE 1 TABLET(100 MG) BY MOUTH DAILY 90 tablet 1   No current facility-administered medications for this visit.    Allergies:   Codeine    Social History:  The patient  reports that she has quit smoking. She has a 15.00 pack-year smoking history. She has quit using smokeless tobacco.  Her smokeless tobacco use included snuff. She reports that she does not drink alcohol or use drugs.   Family History:  The patient's family history includes Heart disease in her mother.    ROS:  Please see the history of present illness.   Otherwise, review of systems are positive for none.   All other systems are reviewed and negative.    PHYSICAL EXAM: VS:  BP (!) 126/50 (BP Location: Left Arm, Patient Position: Sitting, Cuff Size: Large)   Pulse 67   Ht 5\' 4"  (1.626 m)  Wt 276 lb 8 oz (125.4 kg)   SpO2 94%   BMI 47.46 kg/m  , BMI Body mass index is 47.46 kg/m. GEN: Well nourished, well developed, in no acute distress  HEENT: normal  Neck: no JVD, carotid bruits, or masses Cardiac: RRR; no murmurs, rubs, or gallops, mild to moderate bilateral leg edema Respiratory:  clear to auscultation bilaterally, normal work of breathing GI: soft, nontender, nondistended, + BS MS: no deformity or atrophy  Skin: warm and dry, no rash Neuro:  Strength and sensation are intact Psych: euthymic mood, full affect   EKG:  EKG is ordered today. The ekg ordered today  demonstrates normal sinus rhythm with nonspecific T wave changes.   Recent Labs: 07/19/2019: ALT 3; BUN 15; Creat 0.71; Potassium 4.2; Sodium 143    Lipid Panel    Component Value Date/Time   CHOL 142 07/19/2019 0905   CHOL 157 05/04/2012 0311   TRIG 107 07/19/2019 0905   TRIG 93 05/04/2012 0311   HDL 51 07/19/2019 0905   HDL 58 05/04/2012 0311   CHOLHDL 2.8 07/19/2019 0905   VLDL 19 05/04/2012 0311   LDLCALC 72 07/19/2019 0905   LDLCALC 80 05/04/2012 0311      Wt Readings from Last 3 Encounters:  02/17/20 276 lb 8 oz (125.4 kg)  11/01/19 271 lb 9.6 oz (123.2 kg)  07/14/19 272 lb (123.4 kg)      PAD Screen 09/17/2018  Previous PAD dx? No  Previous surgical procedure? No  Pain with walking? Yes  Subsides with rest? Yes  Feet/toe relief with dangling? No  Painful, non-healing ulcers? No  Extremities discolored? No      ASSESSMENT AND PLAN:  1.  Coronary artery disease involving native coronary arteries without angina: The patient had bare-metal stent placement to the mid LAD in 2011 with no recurrent ischemic events.  Continue medical therapy.   I elected to discontinue clopidogrel at this time.  Continue aspirin indefinitely.  2.  Chronic diastolic heart failure: She appears to be euvolemic on current dose of torsemide.  We should attempt to decrease the dose of torsemide at some point given that her renal function is normal.  3.  Essential hypertension: Amlodipine is likely worsening her chronic leg edema.  I elected to stop amlodipine and increase losartan to 50 mg daily.  Continue carvedilol.  4.  Hyperlipidemia: Continue atorvastatin 40 mg once daily.  Most recent lipid profile showed an LDL of 72 which is close to target.    Disposition:   FU with me in 6 months    Signed, Kathlyn Sacramento, MD 02/17/20 [place], [place]

## 2020-02-17 NOTE — Patient Instructions (Signed)
Medication Instructions:  Your physician has recommended you make the following change in your medication:   1) STOP Plavix 2) STOP Amlodipine 3) INCREASE Losartan 50 mg daily. An Rx has been sent to your pharmacy.  *If you need a refill on your cardiac medications before your next appointment, please call your pharmacy*   Lab Work: None ordered If you have labs (blood work) drawn today and your tests are completely normal, you will receive your results only by: Marland Kitchen MyChart Message (if you have MyChart) OR . A paper copy in the mail If you have any lab test that is abnormal or we need to change your treatment, we will call you to review the results.   Testing/Procedures: None ordered   Follow-Up: At [facility], you and your health needs are our priority.  As part of our continuing mission to provide you with exceptional heart care, we have created designated Provider Care Teams.  These Care Teams include your primary Cardiologist (physician) and Advanced Practice Providers (APPs -  Physician Assistants and Nurse Practitioners) who all work together to provide you with the care you need, when you need it.  We recommend signing up for the patient portal called "MyChart".  Sign up information is provided on this After Visit Summary.  MyChart is used to connect with patients for Virtual Visits (Telemedicine).  Patients are able to view lab/test results, encounter notes, upcoming appointments, etc.  Non-urgent messages can be sent to your provider as well.   To learn more about what you can do with MyChart, go to [email or web address].    Your next appointment:   6 month(s)  The format for your next appointment:   In Person  Provider:    You may see Dr. Kirke Corin or one of the following Advanced Practice Providers on your designated Care Team:    Nicolasa Ducking, NP  Eula Listen, PA-C  Marisue Ivan, PA-C    Other Instructions N/A

## 2020-03-07 DIAGNOSIS — J449 Chronic obstructive pulmonary disease, unspecified: Secondary | ICD-10-CM | POA: Diagnosis not present

## 2020-04-04 ENCOUNTER — Other Ambulatory Visit: Payer: Self-pay | Admitting: Nurse Practitioner

## 2020-04-04 DIAGNOSIS — K219 Gastro-esophageal reflux disease without esophagitis: Secondary | ICD-10-CM

## 2020-04-04 MED ORDER — DEXILANT 60 MG PO CPDR: 1.0000 | DELAYED_RELEASE_CAPSULE | Freq: Every day | ORAL | 0 refills | Status: DC

## 2020-04-04 NOTE — Telephone Encounter (Signed)
Medication Refill - Medication:  dexlansoprazole (DEXILANT) 60 MG capsule  Has the patient contacted their pharmacy? Yes advised to call office. Pt misplaced bottle and is needing a refill.  Preferred Pharmacy (with phone number or street name):  [facility] ST[facility] #[id number] [place], [place] - [address] AT [facility]  [address] [place] [address]  Phone: [phone] Fax: [phone]     Agent: Please be advised that RX refills may take up to 3 business days. We ask that you follow-up with your pharmacy.

## 2020-04-07 ENCOUNTER — Encounter: Payer: Self-pay | Admitting: Family Medicine

## 2020-04-07 DIAGNOSIS — J449 Chronic obstructive pulmonary disease, unspecified: Secondary | ICD-10-CM | POA: Diagnosis not present

## 2020-04-07 DIAGNOSIS — R7303 Prediabetes: Secondary | ICD-10-CM | POA: Insufficient documentation

## 2020-04-10 ENCOUNTER — Encounter: Payer: Self-pay | Admitting: Family Medicine

## 2020-04-10 ENCOUNTER — Ambulatory Visit (INDEPENDENT_AMBULATORY_CARE_PROVIDER_SITE_OTHER): Payer: Medicare Other | Admitting: Family Medicine

## 2020-04-10 ENCOUNTER — Other Ambulatory Visit: Payer: Self-pay

## 2020-04-10 VITALS — BP 147/67 | HR 70 | Temp 97.1°F | Ht 64.0 in | Wt 271.0 lb

## 2020-04-10 DIAGNOSIS — R1084 Generalized abdominal pain: Secondary | ICD-10-CM

## 2020-04-10 DIAGNOSIS — R1013 Epigastric pain: Secondary | ICD-10-CM

## 2020-04-10 DIAGNOSIS — R7303 Prediabetes: Secondary | ICD-10-CM | POA: Diagnosis not present

## 2020-04-10 DIAGNOSIS — R109 Unspecified abdominal pain: Secondary | ICD-10-CM | POA: Insufficient documentation

## 2020-04-10 LAB — POCT GLYCOSYLATED HEMOGLOBIN (HGB A1C)
HbA1c, POC (prediabetic range): 6.2 % (ref 5.7–6.4)
Hemoglobin A1C: 6.2 % — AB (ref 4.0–5.6)

## 2020-04-10 MED ORDER — ALUM & MAG HYDROXIDE-SIMETH 400-400-40 MG/5ML PO SUSP: 5.0000 mL | Freq: Four times a day (QID) | ORAL | 0 refills | Status: DC | PRN

## 2020-04-10 NOTE — Progress Notes (Signed)
Subjective:    Patient ID: Samantha Cooper, female    DOB: 1935/07/07, 85 y.o.   MRN: [id number]  Samantha Cooper is a 85 y.o. female presenting on 04/10/2020 for Abdominal Pain (upper mib abdominal pain after eating or drinking x 2 weeks. She describe it as a pressure sensation that last about 5 minutes after eating or drinking. )   HPI  Ms. Barnfield presents to clinic for concerns of epigastric discomfort x 2 weeks that lasts approx 5 minutes after eating and drinking.  Denies any fevers, nausea, vomiting, diarrhea, previous history of this abdominal pain, changes in eating/drinking or bowel habits.  Denies anyone else in the household with similar symptoms.  Has not taken anything for her symptoms.  Depression screen [facility] 2/9 11/01/2019 01/05/2019 01/15/2018  Decreased Interest 0 0 0  Down, Depressed, Hopeless 0 0 0  PHQ - 2 Score 0 0 0    Social History   Tobacco Use  . Smoking status: Former Smoker    Packs/day: 1.50    Years: 10.00    Pack years: 15.00  . Smokeless tobacco: Former Systems developer    Types: Snuff  . Tobacco comment: unsure when she quit, over 30 years ago   Vaping Use  . Vaping Use: Never assessed  Substance Use Topics  . Alcohol use: No  . Drug use: No    Review of Systems  Constitutional: Negative.   HENT: Negative.   Eyes: Negative.   Respiratory: Negative.   Cardiovascular: Negative.   Gastrointestinal: Positive for abdominal pain. Negative for abdominal distention, anal bleeding, blood in stool, constipation, diarrhea, nausea, rectal pain and vomiting.  Endocrine: Negative.   Genitourinary: Negative.   Musculoskeletal: Negative.   Skin: Negative.   Allergic/Immunologic: Negative.   Neurological: Negative.   Hematological: Negative.   Psychiatric/Behavioral: Negative.    Per HPI unless specifically indicated above     Objective:    BP (!) 147/67 (BP Location: Left Arm, Patient Position: Sitting, Cuff Size: Large)   Pulse 70   Temp (!) 97.1 F  (36.2 C) (Temporal)   Ht 5\' 4"  (1.626 m)   Wt 271 lb (122.9 kg)   BMI 46.52 kg/m   Wt Readings from Last 3 Encounters:  04/10/20 271 lb (122.9 kg)  02/17/20 276 lb 8 oz (125.4 kg)  11/01/19 271 lb 9.6 oz (123.2 kg)    Physical Exam Vitals reviewed.  Constitutional:      General: She is not in acute distress.    Appearance: Normal appearance. She is well-developed and well-groomed. She is obese. She is not ill-appearing or toxic-appearing.  HENT:     Head: Normocephalic and atraumatic.     Nose:     Comments: Lizbeth Bark is in place, covering mouth and nose  Eyes:     General: Lids are normal. Vision grossly intact.        Right eye: No discharge.        Left eye: No discharge.     Extraocular Movements: Extraocular movements intact.     Conjunctiva/sclera: Conjunctivae normal.     Pupils: Pupils are equal, round, and reactive to light.  Cardiovascular:     Rate and Rhythm: Normal rate and regular rhythm.     Pulses: Normal pulses.     Heart sounds: Normal heart sounds. No murmur heard.  No friction rub. No gallop.   Pulmonary:     Effort: Pulmonary effort is normal. No respiratory distress.     Breath sounds:  Normal breath sounds.  Abdominal:     General: Bowel sounds are normal. There is no distension. There are no signs of injury.     Palpations: Abdomen is soft.     Tenderness: There is abdominal tenderness in the epigastric area. There is no right CVA tenderness, left CVA tenderness, guarding or rebound.     Hernia: No hernia is present.     Comments: Unable to palpate for hepatomegaly or splenomegaly due to body habitus  Musculoskeletal:     Right lower leg: No edema.     Left lower leg: No edema.  Skin:    General: Skin is warm and dry.     Capillary Refill: Capillary refill takes less than 2 seconds.  Neurological:     General: No focal deficit present.     Mental Status: She is alert and oriented to person, place, and time.     Cranial Nerves: No cranial nerve  deficit.     Sensory: No sensory deficit.     Motor: No weakness.     Coordination: Coordination normal.     Gait: Gait abnormal.     Comments: Ambulating with walker  Psychiatric:        Attention and Perception: Attention and perception normal.        Mood and Affect: Mood and affect normal. Mood is not anxious or depressed.        Speech: Speech normal.        Behavior: Behavior normal. Behavior is cooperative.        Thought Content: Thought content normal.        Cognition and Memory: Cognition and memory normal.        Judgment: Judgment normal.    Results for orders placed or performed in visit on 04/10/20  POCT glycosylated hemoglobin (Hb A1C)  Result Value Ref Range   Hemoglobin A1C 6.2 (A) 4.0 - 5.6 %   HbA1c POC (<> result, manual entry)     HbA1c, POC (prediabetic range) 6.2 5.7 - 6.4 %   HbA1c, POC (controlled diabetic range)        Assessment & Plan:   Problem List Items Addressed This Visit      Other   Prediabetes   Relevant Orders   POCT glycosylated hemoglobin (Hb A1C) (Completed)   Abdominal pain - Primary    Epigastric discomfort after eating and drinking x 2 weeks.  Likely H. Pylori vs gastric ulcer.  Currently on dexilant and reports taking and tolerating well.  Will rx Maalox to help with discomfort until test results have returned.  Plan: 1. CBC, CMP and H. Pylori breath test labs ordered 2. To begin Maalox 23mL up to 4x per day as needed for GI discomfort 3. Continue Dexilant as prescribed 4. Return to clinic in 4 weeks      Relevant Medications   alum & mag hydroxide-simeth (MAALOX MAX) 400-400-40 MG/5ML suspension   Other Relevant Orders   H. pylori breath test   CBC with Differential   COMPLETE METABOLIC PANEL WITH GFR      Meds ordered this encounter  Medications  . alum & mag hydroxide-simeth (MAALOX MAX) 400-400-40 MG/5ML suspension    Sig: Take 5 mLs by mouth every 6 (six) hours as needed for indigestion.    Dispense:  355 mL     Refill:  0      Follow up plan: Return in about 4 weeks (around 05/08/2020) for Abdominal pain f/u.   Joni Reining  Carmie End, FNP Family Nurse Practitioner [facility] 04/10/2020, 8:56 AM

## 2020-04-10 NOTE — Assessment & Plan Note (Signed)
Epigastric discomfort after eating and drinking x 2 weeks.  Likely H. Pylori vs gastric ulcer.  Currently on dexilant and reports taking and tolerating well.  Will rx Maalox to help with discomfort until test results have returned.  Plan: 1. CBC, CMP and H. Pylori breath test labs ordered 2. To begin Maalox 34mL up to 4x per day as needed for GI discomfort 3. Continue Dexilant as prescribed 4. Return to clinic in 4 weeks

## 2020-04-10 NOTE — Patient Instructions (Signed)
As we discussed, likely upper GI concerns sound like H. Pylori or gastric ulceration.  I have sent in a prescription for Maalox to use as directed to help with symptoms while we await test results.  Have your labs and breath test completed prior to leaving clinic today and I will contact you once we have the results.  Continue all of your medications as prescribed.  We will plan to see you back in 4 weeks for abdominal pain f/u  You will receive a survey after today's visit either digitally by e-mail or paper by USPS mail. Your experiences and feedback matter to [place].  Please respond so we know how we are doing as we provide care for you.  Call us with any questions/concerns/needs.  It is my goal to be available to you for your health concerns.  Thanks for choosing me to be a partner in your healthcare needs!  Charlaine Dalton, FNP-C Family Nurse Practitioner [facility] Phone: [phone]

## 2020-04-11 LAB — CBC WITH DIFFERENTIAL/PLATELET
Absolute Monocytes: 578 cells/uL (ref 200–950)
Basophils Absolute: 20 cells/uL (ref 0–200)
Basophils Relative: 0.3 %
Eosinophils Absolute: 82 cells/uL (ref 15–500)
Eosinophils Relative: 1.2 %
HCT: 44.5 % (ref 35.0–45.0)
Hemoglobin: 13.6 g/dL (ref 11.7–15.5)
Lymphs Abs: 2108 cells/uL (ref 850–3900)
MCH: 28.1 pg (ref 27.0–33.0)
MCHC: 30.6 g/dL — ABNORMAL LOW (ref 32.0–36.0)
MCV: 91.9 fL (ref 80.0–100.0)
MPV: 10.3 fL (ref 7.5–12.5)
Monocytes Relative: 8.5 %
Neutro Abs: 4012 cells/uL (ref 1500–7800)
Neutrophils Relative %: 59 %
Platelets: 190 10*3/uL (ref 140–400)
RBC: 4.84 10*6/uL (ref 3.80–5.10)
RDW: 12.9 % (ref 11.0–15.0)
Total Lymphocyte: 31 %
WBC: 6.8 10*3/uL (ref 3.8–10.8)

## 2020-04-11 LAB — COMPLETE METABOLIC PANEL WITH GFR
AG Ratio: 1.2 (calc) (ref 1.0–2.5)
ALT: 5 U/L — ABNORMAL LOW (ref 6–29)
AST: 11 U/L (ref 10–35)
Albumin: 3.7 g/dL (ref 3.6–5.1)
Alkaline phosphatase (APISO): 116 U/L (ref 37–153)
BUN: 17 mg/dL (ref 7–25)
CO2: 36 mmol/L — ABNORMAL HIGH (ref 20–32)
Calcium: 8.8 mg/dL (ref 8.6–10.4)
Chloride: 101 mmol/L (ref 98–110)
Creat: 0.78 mg/dL (ref 0.60–0.88)
GFR, Est African American: 81 mL/min/{1.73_m2} (ref >=60)
GFR, Est Non African American: 70 mL/min/{1.73_m2} (ref >=60)
Globulin: 3.1 g/dL (calc) (ref 1.9–3.7)
Glucose, Bld: 98 mg/dL (ref 65–99)
Potassium: 4 mmol/L (ref 3.5–5.3)
Sodium: 143 mmol/L (ref 135–146)
Total Bilirubin: 0.4 mg/dL (ref 0.2–1.2)
Total Protein: 6.8 g/dL (ref 6.1–8.1)

## 2020-04-11 LAB — H. PYLORI BREATH TEST: H. pylori Breath Test: NOT DETECTED

## 2020-05-01 ENCOUNTER — Other Ambulatory Visit: Payer: Self-pay | Admitting: Cardiovascular Disease

## 2020-05-01 ENCOUNTER — Other Ambulatory Visit: Payer: Self-pay | Admitting: Family Medicine

## 2020-05-01 DIAGNOSIS — I10 Essential (primary) hypertension: Secondary | ICD-10-CM

## 2020-05-01 DIAGNOSIS — I2510 Atherosclerotic heart disease of native coronary artery without angina pectoris: Secondary | ICD-10-CM

## 2020-05-01 DIAGNOSIS — E782 Mixed hyperlipidemia: Secondary | ICD-10-CM

## 2020-05-07 DIAGNOSIS — J449 Chronic obstructive pulmonary disease, unspecified: Secondary | ICD-10-CM | POA: Diagnosis not present

## 2020-05-29 ENCOUNTER — Other Ambulatory Visit: Payer: Self-pay | Admitting: Cardiovascular Disease

## 2020-06-07 DIAGNOSIS — J449 Chronic obstructive pulmonary disease, unspecified: Secondary | ICD-10-CM | POA: Diagnosis not present

## 2020-06-19 ENCOUNTER — Other Ambulatory Visit: Payer: Self-pay

## 2020-06-19 ENCOUNTER — Ambulatory Visit (INDEPENDENT_AMBULATORY_CARE_PROVIDER_SITE_OTHER): Payer: Medicare Other | Admitting: Family Medicine

## 2020-06-19 ENCOUNTER — Encounter: Payer: Self-pay | Admitting: Family Medicine

## 2020-06-19 VITALS — BP 129/61 | HR 64 | Temp 98.0°F | Resp 20 | Ht 64.0 in | Wt 270.2 lb

## 2020-06-19 DIAGNOSIS — E782 Mixed hyperlipidemia: Secondary | ICD-10-CM | POA: Diagnosis not present

## 2020-06-19 DIAGNOSIS — R7303 Prediabetes: Secondary | ICD-10-CM | POA: Diagnosis not present

## 2020-06-19 DIAGNOSIS — K219 Gastro-esophageal reflux disease without esophagitis: Secondary | ICD-10-CM

## 2020-06-19 DIAGNOSIS — I2510 Atherosclerotic heart disease of native coronary artery without angina pectoris: Secondary | ICD-10-CM

## 2020-06-19 DIAGNOSIS — Z0000 Encounter for general adult medical examination without abnormal findings: Secondary | ICD-10-CM | POA: Diagnosis not present

## 2020-06-19 DIAGNOSIS — I10 Essential (primary) hypertension: Secondary | ICD-10-CM

## 2020-06-19 LAB — POCT URINALYSIS DIPSTICK
Bilirubin, UA: NEGATIVE
Blood, UA: NEGATIVE
Glucose, UA: NEGATIVE
Ketones, UA: NEGATIVE
Leukocytes, UA: NEGATIVE
Nitrite, UA: NEGATIVE
Protein, UA: NEGATIVE
Spec Grav, UA: 1.02 (ref 1.010–1.025)
Urobilinogen, UA: 4 E.U./dL — AB
pH, UA: 5 (ref 5.0–8.0)

## 2020-06-19 LAB — POCT UA - MICROALBUMIN: Microalbumin Ur, POC: 100 mg/L

## 2020-06-19 MED ORDER — DEXILANT 60 MG PO CPDR: 1.0000 | DELAYED_RELEASE_CAPSULE | Freq: Every day | ORAL | 0 refills | Status: DC

## 2020-06-19 MED ORDER — CLOPIDOGREL BISULFATE 75 MG PO TABS: 75.0000 mg | ORAL_TABLET | Freq: Every day | ORAL | 1 refills | Status: AC

## 2020-06-19 MED ORDER — ATORVASTATIN CALCIUM 40 MG PO TABS: ORAL_TABLET | ORAL | 1 refills | Status: DC

## 2020-06-19 MED ORDER — LOSARTAN POTASSIUM 25 MG PO TABS: 25.0000 mg | ORAL_TABLET | Freq: Every day | ORAL | 1 refills | Status: AC

## 2020-06-19 MED ORDER — TORSEMIDE 100 MG PO TABS: ORAL_TABLET | ORAL | 1 refills | Status: AC

## 2020-06-19 NOTE — Assessment & Plan Note (Signed)
Stable and well controlled.  Currently taking atorvastatin 40mg  daily and tolerating it well.  Reviewed common side effects of myalgia (reversible off med), less common side effects of cognitive impairment (reversible off med), increased glucose, rhabdomyolysis. Follows with Cardiology  Plan: 1) Labs due at next visit 2) Continue atorvastatin 40mg  daily  3) Heart healthy diet and to exercise every other day for 30 minutes per day, going no more than 2 days in a row without exercise. 4) We will see you back in 3 months

## 2020-06-19 NOTE — Assessment & Plan Note (Signed)
Last A1C 6.2% on 04/10/2020.  Will re-evaluate at next visit.  Patient reports has been working on dietary modifications.

## 2020-06-19 NOTE — Patient Instructions (Signed)
Your medication refills have been sent to your pharmacy on file.  Continue all medications as prescribed.  Try to get exercise a minimum of 30 minutes per day at least 5 days per week as well as  adequate water intake all while measuring blood pressure a few times per week.  Keep a blood pressure log and bring back to clinic at your next visit.  If your readings are consistently over 130/80 to contact our office/send me a MyChart message and we will see you sooner.  Can try DASH and Mediterranean diet options, avoiding processed foods, lowering sodium intake, avoiding pork products, and eating a plant based diet for optimal health.  Education and discussion with patient regarding hypertension as well as the effects on the organs and body.  Specifically, we spoke about kidney disease, kidney failure, heart attack, stroke and up to and including death, as likely outcomes if non-compliant with blood pressure regulation.  Discussed how all of these habits are attached to each other and each has the effect on each other.  We will plan to see you back in 3 months for hypertension and prediabetes follow up visit  You will receive a survey after today's visit either digitally by e-mail or paper by USPS mail. Your experiences and feedback matter to [place].  Please respond so we know how we are doing as we provide care for you.  Call us with any questions/concerns/needs.  It is my goal to be available to you for your health concerns.  Thanks for choosing me to be a partner in your healthcare needs!  Charlaine Dalton, FNP-C Family Nurse Practitioner [facility] Phone: [phone]

## 2020-06-19 NOTE — Assessment & Plan Note (Signed)
Reports is stable and following with Cardiology, Dr. Kirke Corin.

## 2020-06-19 NOTE — Assessment & Plan Note (Signed)
Annual physical exam without new findings.  Well adult with no acute concerns.  Plan: 1. Obtain health maintenance screenings as above according to age. - Increase physical activity to 30 minutes most days of the week.  - Eat healthy diet high in vegetables and fruits; low in refined carbohydrates. - Screening labs and tests as ordered 2. Return 1 year for annual physical.  

## 2020-06-19 NOTE — Assessment & Plan Note (Signed)
Currently well controlled on dexlansoprazole 60mg once daily.  Plan: 1. Continue dexlansoprazole 60mg once daily. Side effects discussed. Pt wants to continue med. 2. Avoid diet triggers. Reviewed need to seek care if globus sensation, difficulty swallowing, s/sx of GI bleed. 3. Follow up as needed and in 6 months.  

## 2020-06-19 NOTE — Progress Notes (Signed)
Subjective:    Patient ID: Samantha Cooper, female    DOB: 08/05/1935, 85 y.o.   MRN: [id number]  Samantha Cooper is a 85 y.o. female presenting on 06/19/2020 for Annual Exam   HPI  HEALTH MAINTENANCE:  Weight/BMI: Morbid obesity, BMI 46.38% Physical activity: No structured exercise Diet: Regular Seatbelt: Yes Sunscreen: Yes DEXA: Declines HIV & Hep C Screening: Declines GC/CT: Declines Optometry: As needed Dentistry: As needed  IMMUNIZATIONS: Influenza: Due this season Tetanus: Due, encouraged COVID: Due, provided information for local vaccination sites Pneumonia: Encouraged  Depression screen [facility] 2/9 11/01/2019 01/05/2019 01/15/2018  Decreased Interest 0 0 0  Down, Depressed, Hopeless 0 0 0  PHQ - 2 Score 0 0 0    Past Medical History:  Diagnosis Date  . Heart disease   . History of heart attack   . Hyperlipidemia   . Osteoarthritis 09/26/2017  . Oxygen deficiency    Past Surgical History:  Procedure Laterality Date  . TUBAL LIGATION     Social History   Socioeconomic History  . Marital status: Divorced    Spouse name: Not on file  . Number of children: Not on file  . Years of education: Not on file  . Highest education level: 7th grade  Occupational History  . Not on file  Tobacco Use  . Smoking status: Former Smoker    Packs/day: 1.50    Years: 10.00    Pack years: 15.00  . Smokeless tobacco: Former Neurosurgeon    Types: Snuff  . Tobacco comment: unsure when she quit, over 30 years ago   Vaping Use  . Vaping Use: Never assessed  Substance and Sexual Activity  . Alcohol use: No  . Drug use: No  . Sexual activity: Not on file  Other Topics Concern  . Not on file  Social History Narrative   Goes to church, no other social activities. Has family that lives close by. Grandson stays the night every other night or so. Her daughter comes by daily.    Social Determinants of Health   Financial Resource Strain:   . Difficulty of Paying Living  Expenses: Not on file  Food Insecurity:   . Worried About Programme researcher, broadcasting/film/video in the Last Year: Not on file  . Ran Out of Food in the Last Year: Not on file  Transportation Needs:   . Lack of Transportation (Medical): Not on file  . Lack of Transportation (Non-Medical): Not on file  Physical Activity:   . Days of Exercise per Week: Not on file  . Minutes of Exercise per Session: Not on file  Stress:   . Feeling of Stress : Not on file  Social Connections:   . Frequency of Communication with Friends and Family: Not on file  . Frequency of Social Gatherings with Friends and Family: Not on file  . Attends Religious Services: Not on file  . Active Member of Clubs or Organizations: Not on file  . Attends Banker Meetings: Not on file  . Marital Status: Not on file  Intimate Partner Violence:   . Fear of Current or Ex-Partner: Not on file  . Emotionally Abused: Not on file  . Physically Abused: Not on file  . Sexually Abused: Not on file   Family History  Problem Relation Age of Onset  . Heart disease Mother    Current Outpatient Medications on File Prior to Visit  Medication Sig  . aspirin 81 MG tablet aspirin 81 mg  tablet,delayed release  take 1 tablet by mouth once daily  . carvedilol (COREG) 3.125 MG tablet TAKE 1 TABLET(3.125 MG) BY MOUTH TWICE DAILY WITH A MEAL  . [DISCONTINUED] atorvastatin (LIPITOR) 40 MG tablet Take 1 tablet (40 mg total) by mouth daily.   No current facility-administered medications on file prior to visit.    Per HPI unless specifically indicated above     Objective:    BP 129/61 (BP Location: Left Arm, Patient Position: Sitting, Cuff Size: Large)   Pulse 64   Temp 98 F (36.7 C) (Oral)   Resp 20   Ht 5\' 4"  (1.626 m)   Wt 270 lb 3.2 oz (122.6 kg)   SpO2 95%   BMI 46.38 kg/m   Wt Readings from Last 3 Encounters:  06/19/20 270 lb 3.2 oz (122.6 kg)  04/10/20 271 lb (122.9 kg)  02/17/20 276 lb 8 oz (125.4 kg)    Physical  Exam Vitals reviewed.  Constitutional:      General: She is not in acute distress.    Appearance: Normal appearance. She is well-developed and well-groomed. She is morbidly obese. She is not ill-appearing or toxic-appearing.  HENT:     Head: Normocephalic and atraumatic.     Right Ear: Tympanic membrane, ear canal and external ear normal. There is no impacted cerumen.     Left Ear: Tympanic membrane, ear canal and external ear normal. There is no impacted cerumen.     Nose: Nose normal. No congestion or rhinorrhea.     Mouth/Throat:     Lips: Pink.     Mouth: Mucous membranes are moist.     Pharynx: Oropharynx is clear. Uvula midline. No oropharyngeal exudate or posterior oropharyngeal erythema.  Eyes:     General: Lids are normal. Vision grossly intact. No scleral icterus.       Right eye: No discharge.        Left eye: No discharge.     Extraocular Movements: Extraocular movements intact.     Conjunctiva/sclera: Conjunctivae normal.     Pupils: Pupils are equal, round, and reactive to light.  Neck:     Thyroid: No thyroid mass or thyromegaly.  Cardiovascular:     Rate and Rhythm: Normal rate and regular rhythm.     Pulses: Normal pulses.          Dorsalis pedis pulses are 2+ on the right side and 2+ on the left side.     Heart sounds: Normal heart sounds. No murmur heard.  No friction rub. No gallop.   Pulmonary:     Effort: Pulmonary effort is normal. No respiratory distress.     Breath sounds: Normal breath sounds.  Abdominal:     General: Abdomen is flat. Bowel sounds are normal. There is no distension.     Palpations: Abdomen is soft. There is no mass.     Tenderness: There is no abdominal tenderness. There is no guarding or rebound.     Hernia: No hernia is present.     Comments: Unable to palpate for hepatomegaly or splenomegaly due to body habitus  Musculoskeletal:        General: Normal range of motion.     Cervical back: Normal range of motion and neck supple. No  tenderness.     Right lower leg: No edema.     Left lower leg: No edema.     Comments: Normal tone, strength 5/5 BUE & BLE  Feet:     Right foot:  Skin integrity: Skin integrity normal.     Left foot:     Skin integrity: Skin integrity normal.  Lymphadenopathy:     Cervical: No cervical adenopathy.  Skin:    General: Skin is warm and dry.     Capillary Refill: Capillary refill takes less than 2 seconds.  Neurological:     General: No focal deficit present.     Mental Status: She is alert and oriented to person, place, and time.     Cranial Nerves: No cranial nerve deficit.     Sensory: No sensory deficit.     Motor: No weakness.     Coordination: Coordination normal.     Gait: Gait normal.     Deep Tendon Reflexes: Reflexes normal.  Psychiatric:        Attention and Perception: Attention and perception normal.        Mood and Affect: Mood and affect normal.        Speech: Speech normal.        Behavior: Behavior normal. Behavior is cooperative.        Thought Content: Thought content normal.        Cognition and Memory: Cognition and memory normal.        Judgment: Judgment normal.     Results for orders placed or performed in visit on 06/19/20  POCT UA - Microalbumin  Result Value Ref Range   Microalbumin Ur, POC 100 mg/L   Creatinine, POC     Albumin/Creatinine Ratio, Urine, POC    POCT Urinalysis Dipstick  Result Value Ref Range   Color, UA dark yellow    Clarity, UA cloudy    Glucose, UA Negative Negative   Bilirubin, UA negative    Ketones, UA negative    Spec Grav, UA 1.020 1.010 - 1.025   Blood, UA negative    pH, UA 5.0 5.0 - 8.0   Protein, UA Negative Negative   Urobilinogen, UA 4.0 (A) 0.2 or 1.0 E.U./dL   Nitrite, UA negative    Leukocytes, UA Negative Negative   Appearance     Odor        Assessment & Plan:   Problem List Items Addressed This Visit      Cardiovascular and Mediastinum   Hypertension    Controlled hypertension.  BP is at  goal < 130/80.  Pt is not working on lifestyle modifications.  Taking medications tolerating well without side effects.  Complications:  Morbid obesity, GERD, Hyperlipidemia, Prediabetes  Plan: 1. Continue taking Losartan 25mg  daily, torsemide 100mg  daily and carvedilol 3.125mg  twice daily 2. Obtain labs at next visit  3. Encouraged heart healthy diet and increasing exercise to 30 minutes most days of the week, going no more than 2 days in a row without exercise. 4. Check BP 1-2 x per week at home, keep log, and bring to clinic at next appointment. 5. Follow up 3 months.         Relevant Medications   atorvastatin (LIPITOR) 40 MG tablet   losartan (COZAAR) 25 MG tablet   torsemide (DEMADEX) 100 MG tablet   Coronary artery disease involving native coronary artery of native heart without angina pectoris    Reports is stable and following with Cardiology, Dr. .      Relevant Medications   atorvastatin (LIPITOR) 40 MG tablet   clopidogrel (PLAVIX) 75 MG tablet   losartan (COZAAR) 25 MG tablet   torsemide (DEMADEX) 100 MG tablet     Digestive  GERD (gastroesophageal reflux disease)    Currently well controlled on dexlansoprazole 60mg  once daily.  Plan: 1. Continue dexlansoprazole 60mg  once daily. Side effects discussed. Pt wants to continue med. 2. Avoid diet triggers. Reviewed need to seek care if globus sensation, difficulty swallowing, s/sx of GI bleed. 3. Follow up as needed and in 6 months.       Relevant Medications   dexlansoprazole (DEXILANT) 60 MG capsule     Other   Hyperlipidemia    Stable and well controlled.  Currently taking atorvastatin 40mg  daily and tolerating it well.  Reviewed common side effects of myalgia (reversible off med), less common side effects of cognitive impairment (reversible off med), increased glucose, rhabdomyolysis. Follows with Cardiology  Plan: 1) Labs due at next visit 2) Continue atorvastatin 40mg  daily  3) Heart healthy diet  and to exercise every other day for 30 minutes per day, going no more than 2 days in a row without exercise. 4) We will see you back in 3 months       Relevant Medications   atorvastatin (LIPITOR) 40 MG tablet   losartan (COZAAR) 25 MG tablet   torsemide (DEMADEX) 100 MG tablet   Prediabetes    Last A1C 6.2% on 04/10/2020.  Will re-evaluate at next visit.  Patient reports has been working on dietary modifications.      Relevant Orders   POCT UA - Microalbumin (Completed)   POCT Urinalysis Dipstick (Completed)   Annual physical exam - Primary    Annual physical exam without new findings.  Well adult with no acute concerns.  Plan: 1. Obtain health maintenance screenings as above according to age. - Increase physical activity to 30 minutes most days of the week.  - Eat healthy diet high in vegetables and fruits; low in refined carbohydrates. - Screening labs and tests as ordered 2. Return 1 year for annual physical.          Meds ordered this encounter  Medications  . atorvastatin (LIPITOR) 40 MG tablet    Sig: Take 1 tablet daily    Dispense:  90 tablet    Refill:  1  . dexlansoprazole (DEXILANT) 60 MG capsule    Sig: Take 1 capsule (60 mg total) by mouth daily.    Dispense:  90 capsule    Refill:  0  . clopidogrel (PLAVIX) 75 MG tablet    Sig: Take 1 tablet (75 mg total) by mouth daily.    Dispense:  90 tablet    Refill:  1  . losartan (COZAAR) 25 MG tablet    Sig: Take 1 tablet (25 mg total) by mouth daily.    Dispense:  90 tablet    Refill:  1  . torsemide (DEMADEX) 100 MG tablet    Sig: TAKE 1 TABLET(100 MG) BY MOUTH DAILY    Dispense:  90 tablet    Refill:  1      Follow up plan: Return in about 3 months (around 09/19/2020) for HTN, Prediabetes F/U.  Charlaine DaltonNicole Marie Morse Brueggemann, FNP-C Family Nurse Practitioner [facility] 06/19/2020, 10:22 AM

## 2020-06-19 NOTE — Assessment & Plan Note (Signed)
Controlled hypertension.  BP is at goal < 130/80.  Pt is not working on lifestyle modifications.  Taking medications tolerating well without side effects.  Complications:  Morbid obesity, GERD, Hyperlipidemia, Prediabetes  Plan: 1. Continue taking Losartan 25mg  daily, torsemide 100mg  daily and carvedilol 3.125mg  twice daily 2. Obtain labs at next visit  3. Encouraged heart healthy diet and increasing exercise to 30 minutes most days of the week, going no more than 2 days in a row without exercise. 4. Check BP 1-2 x per week at home, keep log, and bring to clinic at next appointment. 5. Follow up 3 months.

## 2020-07-08 DIAGNOSIS — J449 Chronic obstructive pulmonary disease, unspecified: Secondary | ICD-10-CM | POA: Diagnosis not present

## 2020-08-07 DIAGNOSIS — J449 Chronic obstructive pulmonary disease, unspecified: Secondary | ICD-10-CM | POA: Diagnosis not present

## 2020-08-29 ENCOUNTER — Ambulatory Visit: Payer: Medicare Other | Admitting: Cardiovascular Disease

## 2020-08-30 ENCOUNTER — Encounter: Payer: Self-pay | Admitting: Cardiovascular Disease

## 2020-09-07 DIAGNOSIS — J449 Chronic obstructive pulmonary disease, unspecified: Secondary | ICD-10-CM | POA: Diagnosis not present

## 2020-09-11 ENCOUNTER — Other Ambulatory Visit: Payer: Self-pay | Admitting: Cardiovascular Disease

## 2020-09-12 ENCOUNTER — Other Ambulatory Visit: Payer: Self-pay

## 2020-09-12 ENCOUNTER — Encounter: Payer: Self-pay | Admitting: Family

## 2020-09-12 ENCOUNTER — Ambulatory Visit (INDEPENDENT_AMBULATORY_CARE_PROVIDER_SITE_OTHER): Payer: Medicare Other | Admitting: Family

## 2020-09-12 VITALS — BP 130/62 | HR 65 | Ht 65.0 in | Wt 270.2 lb

## 2020-09-12 DIAGNOSIS — E785 Hyperlipidemia, unspecified: Secondary | ICD-10-CM

## 2020-09-12 DIAGNOSIS — I10 Essential (primary) hypertension: Secondary | ICD-10-CM | POA: Diagnosis not present

## 2020-09-12 DIAGNOSIS — I5032 Chronic diastolic (congestive) heart failure: Secondary | ICD-10-CM

## 2020-09-12 DIAGNOSIS — I25118 Atherosclerotic heart disease of native coronary artery with other forms of angina pectoris: Secondary | ICD-10-CM

## 2020-09-12 MED ORDER — CARVEDILOL 3.125 MG PO TABS: 3.1250 mg | ORAL_TABLET | Freq: Two times a day (BID) | ORAL | 2 refills | Status: DC

## 2020-09-12 NOTE — Patient Instructions (Signed)
Medication Instructions:  No medication changes today.   *If you need a refill on your cardiac medications before your next appointment, please call your pharmacy*  Lab Work: None ordered today.   Testing/Procedures: Your EKG today shows normal sinus rhythm. This is a good result!  Follow-Up: At [facility], you and your health needs are our priority.  As part of our continuing mission to provide you with exceptional heart care, we have created designated Provider Care Teams.  These Care Teams include your primary Cardiologist (physician) and Advanced Practice Providers (APPs -  Physician Assistants and Nurse Practitioners) who all work together to provide you with the care you need, when you need it.  We recommend signing up for the patient portal called "MyChart".  Sign up information is provided on this After Visit Summary.  MyChart is used to connect with patients for Virtual Visits (Telemedicine).  Patients are able to view lab/test results, encounter notes, upcoming appointments, etc.  Non-urgent messages can be sent to your provider as well.   To learn more about what you can do with MyChart, go to [email or web address].    Your next appointment:   6 month(s)  The format for your next appointment:   In Person  Provider:   You may see Lorine Bears, MD or one of the following Advanced Practice Providers on your designated Care Team:   Nicolasa Ducking, NP  Eula Listen, PA-C  Marisue Ivan, PA-C  Cadence [place], [place]  Gillian Shields, NP  Other Instructions  Recommend continue low salt diet.   Recommend reducing your fluid intake to drink less than 2L of fluid per day to help prevent swelling. If your mouth is dry you can try a hard candy such as a peppermint as this helps keep the mouth moist.

## 2020-09-12 NOTE — Progress Notes (Signed)
Office Visit    Patient Name: Samantha Cooper Date of Encounter: 09/12/2020  Primary Care Provider:  Tarri Fuller, FNP Primary Cardiologist:  Lorine Bears, MD Electrophysiologist:  None   Chief Complaint    KARLENE Cooper is a 85 y.o. female with a hx of CAD s/p BMS to LAS 02/2010, HLD, HTN, arthritis, chronic diastolic heart failure, chronic LE edema due to chronic venous insufficiency and obesity presents today for follow up of CAD.   Past Medical History    Past Medical History:  Diagnosis Date  . Heart disease   . History of heart attack   . Hyperlipidemia   . Osteoarthritis 09/26/2017  . Oxygen deficiency    Past Surgical History:  Procedure Laterality Date  . TUBAL LIGATION      Allergies  Allergies  Allergen Reactions  . Codeine Itching    History of Present Illness    Samantha Cooper is a 85 y.o. female with a hx of CAD s/p BMS to LAS 02/2010, HLD, HTN, arthritis, chronic diastolic heart failure, chronic LE edema due to chronic venous insufficiency and obesity. She was last seen 02/17/20.  Previous cardiac cath 2011 by Dr. Juliann Pares with CTO of mid left circumflex with collaterals from RCA, moderate mid RCA disease, moderate proximal LAD disease, and severe mid LAD stenosis. The mid LAD disease was treated with PCI and BMS. EF normal at that time.  Most recent echo 06/2018 with EF 55-60%, gr1DD< mild MR, mildly dilated LA.  Seen by Dr. Kirke Corin 02/17/20 noting no chest pain, chronic exertional dyspnea, and chronic leg edema which were at her baseline. Her Plavix was discontinued and aspirin continued indefinitely. Due to LE edema her Amlodipine was discontinued and Losartan increased to 50mg  daily.   She presents today for follow losartan 25 mg daily.  Her blood pressure has remained well controlled.  Checks her blood pressure at home daily with readings routinely less than 130/80.  She reports no chest pain, pressure, tightness.  She reports her  exertional dyspnea is stable at her baseline.  Her lower extremity edema has improved since stopping amlodipine.  She wears compression stockings regularly and tries to elevate her lower extremities.  We discussed potentially decreasing her fluid intake to less than 2 L to prevent swelling.  EKGs/Labs/Other Studies Reviewed:   The following studies were reviewed today:  EKG:  EKG is  ordered today.  The ekg ordered today demonstrates NSR 65bpm with no acute ST/T wave changes.   Recent Labs: 04/10/2020: ALT 5; BUN 17; Creat 0.78; Hemoglobin 13.6; Platelets 190; Potassium 4.0; Sodium 143  Recent Lipid Panel    Component Value Date/Time   CHOL 142 07/19/2019 0905   CHOL 157 05/04/2012 0311   TRIG 107 07/19/2019 0905   TRIG 93 05/04/2012 0311   HDL 51 07/19/2019 0905   HDL 58 05/04/2012 0311   CHOLHDL 2.8 07/19/2019 0905   VLDL 19 05/04/2012 0311   LDLCALC 72 07/19/2019 0905   LDLCALC 80 05/04/2012 0311   Home Medications   Current Meds  Medication Sig  . aspirin 81 MG tablet aspirin 81 mg tablet,delayed release  take 1 tablet by mouth once daily  . atorvastatin (LIPITOR) 40 MG tablet Take 1 tablet daily  . carvedilol (COREG) 3.125 MG tablet Take 1 tablet (3.125 mg total) by mouth 2 (two) times daily with a meal.  . clopidogrel (PLAVIX) 75 MG tablet Take 1 tablet (75 mg total) by mouth daily.  07/05/2012 dexlansoprazole (  DEXILANT) 60 MG capsule Take 1 capsule (60 mg total) by mouth daily.  Marland Kitchen losartan (COZAAR) 25 MG tablet Take 1 tablet (25 mg total) by mouth daily.  Marland Kitchen torsemide (DEMADEX) 100 MG tablet TAKE 1 TABLET(100 MG) BY MOUTH DAILY  . [DISCONTINUED] carvedilol (COREG) 3.125 MG tablet TAKE 1 TABLET(3.125 MG) BY MOUTH TWICE DAILY WITH A MEAL    Review of Systems   All other systems reviewed and are otherwise negative except as noted above.  Physical Exam    VS:  BP 130/62 (BP Location: Left Wrist, Patient Position: Sitting, Cuff Size: Normal)   Pulse 65   Ht 5\' 5"  (1.651 m)   Wt  270 lb 3.2 oz (122.6 kg)   SpO2 98%   BMI 44.96 kg/m  , BMI Body mass index is 44.96 kg/m.  Wt Readings from Last 3 Encounters:  09/12/20 270 lb 3.2 oz (122.6 kg)  06/19/20 270 lb 3.2 oz (122.6 kg)  04/10/20 271 lb (122.9 kg)    GEN: Well nourished, overweight, well developed, in no acute distress. HEENT: normal. Neck: Supple, no JVD, carotid bruits, or masses. Cardiac: RRR, no murmurs, rubs, or gallops. No clubbing, cyanosis, edema.  Radials/DP/PT 2+ and equal bilaterally.  Respiratory:  Respirations regular and unlabored, clear to auscultation bilaterally. GI: Soft, nontender, nondistended. MS: No deformity or atrophy. Skin: Warm and dry, no rash.  Compression stockings in place Neuro:  Strength and sensation are intact. Psych: Normal affect.  Assessment & Plan    1. CAD -stable with no anginal symptoms.  EKG without acute ST/T wave changes.  No indication for ischemic evaluation at this time.  Continue GDMT including aspirin, Coreg, atorvastatin.  Refill of carvedilol provided.  2. HLD, LDL goal <70 -continue atorvastatin 40 mg daily.  3. HTN - BP well controlled. Continue current antihypertensive regimen.   4. LE edema / Venous insufficiency / Diastolic heart failure - Lower extremity edema improved since discontinuation of amlodipine.  Dyspnea stable at her baseline likely multifactorial obesity, deconditioning.  She will continue to wear her compression stockings and elevate her lower extremities when sitting.  We did discuss recommendation to reduce fluid intake to less than 2 L/day.  She may try peppermint or a hard candy if her mouth becomes dry.  5. Obesity -weight loss via diet and exercise encouraged.  We discussed the role weight loss would have on her blood pressure, swelling, lipids.  Disposition: Follow up in 6 month(s) with Dr. 04/12/20 or APP   Signed, Kirke Corin, NP 09/12/2020, 1:35 PM [facility]

## 2020-09-18 ENCOUNTER — Telehealth: Payer: Medicare Other | Admitting: Family Medicine

## 2020-09-18 NOTE — Telephone Encounter (Signed)
Copied from CRM [id number]. Topic: Medicare AWV >> Sep 18, 2020  1:49 PM Claudette Laws R wrote: Reason for CRM:   No answer unable to leave message for patient to call back and schedule the Medicare Annual Wellness Visit (AWV) virtually.  Last AWV 01/05/2019  Please schedule at anytime with [facility].  40 minute appointment  Any questions, please call me at [phone]

## 2020-10-07 DIAGNOSIS — J449 Chronic obstructive pulmonary disease, unspecified: Secondary | ICD-10-CM | POA: Diagnosis not present

## 2020-10-12 ENCOUNTER — Telehealth: Payer: Self-pay

## 2020-10-12 NOTE — Telephone Encounter (Signed)
I attempted to contact the patient twice, no answer. The phone call rings twice and then you get a fax signal.

## 2020-10-12 NOTE — Telephone Encounter (Signed)
I also attempted to contact the patient sister Lynden Ang no answer or voicemail set-up.

## 2020-10-12 NOTE — Telephone Encounter (Signed)
Copied from CRM [id number]. Topic: General - Other >> Oct 12, 2020 11:11 AM Adrian Prince D wrote: Reason for CRM: Patient would like her doctor to give her a call back. She is trying to get a rolling walker. She can be reached at [phone]

## 2020-10-17 ENCOUNTER — Telehealth: Payer: Self-pay

## 2020-10-17 NOTE — Telephone Encounter (Signed)
The pt requesting a order for a rollator walker. I scheduled the patient an appt for next Monday, Dec 27th @ 1:40pm.

## 2020-10-17 NOTE — Telephone Encounter (Signed)
Copied from CRM [id number]. Topic: General - Other >> Oct 17, 2020 11:00 AM Jaquita Rector A wrote: Reason for CRM: Patient called in requesting to speak to [facility]. Refused to give any details just stated she need to speak to her about something. Please advise can be reached at Ph# [phone]

## 2020-10-23 ENCOUNTER — Other Ambulatory Visit: Payer: Self-pay

## 2020-10-23 ENCOUNTER — Encounter: Payer: Self-pay | Admitting: Family Medicine

## 2020-10-23 ENCOUNTER — Ambulatory Visit (INDEPENDENT_AMBULATORY_CARE_PROVIDER_SITE_OTHER): Payer: Medicare Other | Admitting: Family Medicine

## 2020-10-23 VITALS — BP 148/72 | HR 72 | Temp 97.6°F | Resp 18 | Ht 65.0 in | Wt 270.6 lb

## 2020-10-23 DIAGNOSIS — R2689 Other abnormalities of gait and mobility: Secondary | ICD-10-CM | POA: Insufficient documentation

## 2020-10-23 NOTE — Progress Notes (Signed)
Subjective:    Patient ID: Samantha Cooper, female    DOB: 1935/04/06, 85 y.o.   MRN: [id number]  Samantha Cooper is a 85 y.o. female presenting on 10/23/2020 for Consult (Pt requesting an order for a rollator walker. )   HPI  Samantha Cooper presents to clinic for evaluation of her impaired gait and mobility with request for rollator walker.  Reports she uses a walker at all times, needs to stop for a rest after walking approximately 50 feet.  Reports that she goes to the store rarely, due to need for a motorized cart to ensure she can safely get through the store.  Denies any recent falls but reports she has decreased her activity to compensate for inability to safely ambulate.    Depression screen [facility] 2/9 11/01/2019 01/05/2019 01/15/2018  Decreased Interest 0 0 0  Down, Depressed, Hopeless 0 0 0  PHQ - 2 Score 0 0 0    Social History   Tobacco Use  . Smoking status: Former Smoker    Packs/day: 1.50    Years: 10.00    Pack years: 15.00  . Smokeless tobacco: Former Neurosurgeon    Types: Snuff  . Tobacco comment: unsure when she quit, over 30 years ago   Substance Use Topics  . Alcohol use: No  . Drug use: No    Review of Systems  Constitutional: Negative.   HENT: Negative.   Eyes: Negative.   Respiratory: Negative.   Cardiovascular: Negative.   Gastrointestinal: Negative.   Endocrine: Negative.   Genitourinary: Negative.   Musculoskeletal: Negative.        Impaired gait  Skin: Negative.   Allergic/Immunologic: Negative.   Neurological: Negative.   Hematological: Negative.   Psychiatric/Behavioral: Negative.    Per HPI unless specifically indicated above     Objective:    BP (!) 148/72 (BP Location: Left Arm, Patient Position: Sitting, Cuff Size: Large)   Pulse 72   Temp 97.6 F (36.4 C) (Temporal)   Resp 18   Ht 5\' 5"  (1.651 m)   Wt 270 lb 9.6 oz (122.7 kg)   SpO2 97%   BMI 45.03 kg/m   Wt Readings from Last 3 Encounters:  10/23/20 270 lb 9.6 oz (122.7 kg)   09/12/20 270 lb 3.2 oz (122.6 kg)  06/19/20 270 lb 3.2 oz (122.6 kg)    Physical Exam Vitals and nursing note reviewed.  Constitutional:      General: She is not in acute distress.    Appearance: Normal appearance. She is well-developed and well-groomed. She is morbidly obese. She is not ill-appearing or toxic-appearing.  HENT:     Head: Normocephalic and atraumatic.     Nose:     Comments: 06/21/20 is in place, covering mouth and nose. Eyes:     General: Lids are normal. Vision grossly intact.        Right eye: No discharge.        Left eye: No discharge.     Extraocular Movements: Extraocular movements intact.     Conjunctiva/sclera: Conjunctivae normal.     Pupils: Pupils are equal, round, and reactive to light.  Cardiovascular:     Rate and Rhythm: Normal rate and regular rhythm.     Pulses: Normal pulses.     Heart sounds: Normal heart sounds. No murmur heard. No friction rub. No gallop.   Pulmonary:     Effort: Pulmonary effort is normal. No respiratory distress.     Breath sounds: Normal  breath sounds.  Skin:    General: Skin is warm and dry.     Capillary Refill: Capillary refill takes less than 2 seconds.  Neurological:     General: No focal deficit present.     Mental Status: She is alert and oriented to person, place, and time.     Cranial Nerves: No cranial nerve deficit.     Sensory: Sensation is intact.     Gait: Gait abnormal.     Comments: Needs walker for ambulation  Psychiatric:        Attention and Perception: Attention and perception normal.        Mood and Affect: Mood and affect normal.        Speech: Speech normal.        Behavior: Behavior normal. Behavior is cooperative.        Thought Content: Thought content normal.        Cognition and Memory: Cognition and memory normal.        Judgment: Judgment normal.    Results for orders placed or performed in visit on 06/19/20  POCT UA - Microalbumin  Result Value Ref Range   Microalbumin Ur, POC  100 mg/L   Creatinine, POC     Albumin/Creatinine Ratio, Urine, POC    POCT Urinalysis Dipstick  Result Value Ref Range   Color, UA dark yellow    Clarity, UA cloudy    Glucose, UA Negative Negative   Bilirubin, UA negative    Ketones, UA negative    Spec Grav, UA 1.020 1.010 - 1.025   Blood, UA negative    pH, UA 5.0 5.0 - 8.0   Protein, UA Negative Negative   Urobilinogen, UA 4.0 (A) 0.2 or 1.0 E.U./dL   Nitrite, UA negative    Leukocytes, UA Negative Negative   Appearance     Odor        Assessment & Plan:   Problem List Items Addressed This Visit      Other   Impaired gait and mobility - Primary    Impaired gait and mobility.  Unable to ambulate at all without walker.  Can walk with walker approximately 50 feet before needing to take a break with sitting down.  Has inhibited her ability to go to the store unless there is a motorized cart available.  No recent falls, but has limited her activity to ensure she has not had more falls.  Would benefit from rollator walker.  Will place order.         No orders of the defined types were placed in this encounter.  Follow up plan: Return in about 6 months (around 04/23/2021).   Charlaine Dalton, FNP Family Nurse Practitioner [facility] 10/23/2020, 2:12 PM

## 2020-10-23 NOTE — Assessment & Plan Note (Signed)
Impaired gait and mobility.  Unable to ambulate at all without walker.  Can walk with walker approximately 50 feet before needing to take a break with sitting down.  Has inhibited her ability to go to the store unless there is a motorized cart available.  No recent falls, but has limited her activity to ensure she has not had more falls.  Would benefit from rollator walker.  Will place order.

## 2020-10-23 NOTE — Patient Instructions (Signed)
I am putting in the order for a rollator walker today.  We will plan to see you back in 6 months for follow up visit  You will receive a survey after today's visit either digitally by e-mail or paper by USPS mail. Your experiences and feedback matter to [place].  Please respond so we know how we are doing as we provide care for you.  Call us with any questions/concerns/needs.  It is my goal to be available to you for your health concerns.  Thanks for choosing me to be a partner in your healthcare needs!  Charlaine Dalton, FNP-C Family Nurse Practitioner [facility] Phone: [phone]

## 2020-10-24 DIAGNOSIS — M1990 Unspecified osteoarthritis, unspecified site: Secondary | ICD-10-CM | POA: Diagnosis not present

## 2020-10-26 ENCOUNTER — Other Ambulatory Visit: Payer: Self-pay | Admitting: Cardiovascular Disease

## 2020-11-07 DIAGNOSIS — J449 Chronic obstructive pulmonary disease, unspecified: Secondary | ICD-10-CM | POA: Diagnosis not present

## 2020-11-30 ENCOUNTER — Other Ambulatory Visit: Payer: Self-pay

## 2020-11-30 DIAGNOSIS — K219 Gastro-esophageal reflux disease without esophagitis: Secondary | ICD-10-CM

## 2020-11-30 MED ORDER — DEXLANSOPRAZOLE 60 MG PO CPDR: 1.0000 | DELAYED_RELEASE_CAPSULE | Freq: Every day | ORAL | 0 refills | Status: AC

## 2020-12-04 ENCOUNTER — Other Ambulatory Visit: Payer: Self-pay | Admitting: Family Medicine

## 2020-12-04 DIAGNOSIS — E782 Mixed hyperlipidemia: Secondary | ICD-10-CM

## 2020-12-04 MED ORDER — ATORVASTATIN CALCIUM 40 MG PO TABS: ORAL_TABLET | ORAL | 0 refills | Status: AC

## 2020-12-04 NOTE — Telephone Encounter (Signed)
Per noted at last visit- labs due next visit- 6/22. Refilled per PCP notes

## 2020-12-04 NOTE — Telephone Encounter (Signed)
Medication Refill - Medication: Atorvastatin   Has the patient contacted their pharmacy? Yes.   Pt states that she has reached out to pharmacy, but they state that there is no refill on prescription and they are needing a new prescription. Pt states that she is completely out of this medication x6 days. Please advise.  (Agent: If no, request that the patient contact the pharmacy for the refill.) (Agent: If yes, when and what did the pharmacy advise?)  Preferred Pharmacy (with phone number or street name):  [facility] ST[facility] #[id number] [place], [place] - [address] AT [facility]  [address] [place] [address]  Phone: [phone] Fax: [phone]  Hours: Not open 24 hours     Agent: Please be advised that RX refills may take up to 3 business days. We ask that you follow-up with your pharmacy.

## 2020-12-08 DIAGNOSIS — J449 Chronic obstructive pulmonary disease, unspecified: Secondary | ICD-10-CM | POA: Diagnosis not present

## 2021-01-05 DIAGNOSIS — J449 Chronic obstructive pulmonary disease, unspecified: Secondary | ICD-10-CM | POA: Diagnosis not present

## 2021-02-05 DIAGNOSIS — J449 Chronic obstructive pulmonary disease, unspecified: Secondary | ICD-10-CM | POA: Diagnosis not present

## 2021-02-19 ENCOUNTER — Ambulatory Visit: Payer: Self-pay | Admitting: Internal Medicine

## 2021-02-19 NOTE — Progress Notes (Deleted)
Subjective:    Patient ID: Samantha Cooper, female    DOB: 10/01/1935, 86 y.o.   MRN: [id number]  HPI  Patient presents to clinic today requesting form completion for [employer].  Review of Systems      Past Medical History:  Diagnosis Date  . Heart disease   . History of heart attack   . Hyperlipidemia   . Osteoarthritis 09/26/2017  . Oxygen deficiency     Current Outpatient Medications  Medication Sig Dispense Refill  . aspirin 81 MG tablet aspirin 81 mg tablet,delayed release  take 1 tablet by mouth once daily    . atorvastatin (LIPITOR) 40 MG tablet Take 1 tablet daily 90 tablet 0  . carvedilol (COREG) 3.125 MG tablet TAKE 1 TABLET(3.125 MG) BY MOUTH TWICE DAILY WITH A MEAL 180 tablet 2  . clopidogrel (PLAVIX) 75 MG tablet Take 1 tablet (75 mg total) by mouth daily. 90 tablet 1  . dexlansoprazole (DEXILANT) 60 MG capsule Take 1 capsule (60 mg total) by mouth daily. 90 capsule 0  . losartan (COZAAR) 25 MG tablet Take 1 tablet (25 mg total) by mouth daily. 90 tablet 1  . torsemide (DEMADEX) 100 MG tablet TAKE 1 TABLET(100 MG) BY MOUTH DAILY 90 tablet 1   No current facility-administered medications for this visit.    Allergies  Allergen Reactions  . Codeine Itching    Family History  Problem Relation Age of Onset  . Heart disease Mother     Social History   Socioeconomic History  . Marital status: Divorced    Spouse name: Not on file  . Number of children: Not on file  . Years of education: Not on file  . Highest education level: 7th grade  Occupational History  . Not on file  Tobacco Use  . Smoking status: Former Smoker    Packs/day: 1.50    Years: 10.00    Pack years: 15.00  . Smokeless tobacco: Former Neurosurgeon    Types: Snuff  . Tobacco comment: unsure when she quit, over 30 years ago   Vaping Use  . Vaping Use: Not on file  Substance and Sexual Activity  . Alcohol use: No  . Drug use: No  . Sexual activity: Not on file  Other  Topics Concern  . Not on file  Social History Narrative   Goes to church, no other social activities. Has family that lives close by. Grandson stays the night every other night or so. Her daughter comes by daily.    Social Determinants of Health   Financial Resource Strain: Not on file  Food Insecurity: Not on file  Transportation Needs: Not on file  Physical Activity: Not on file  Stress: Not on file  Social Connections: Not on file  Intimate Partner Violence: Not on file     Constitutional: Denies fever, malaise, fatigue, headache or abrupt weight changes.  HEENT: Denies eye pain, eye redness, ear pain, ringing in the ears, wax buildup, runny nose, nasal congestion, bloody nose, or sore throat. Respiratory: Denies difficulty breathing, shortness of breath, cough or sputum production.   Cardiovascular: Denies chest pain, chest tightness, palpitations or swelling in the hands or feet.  Gastrointestinal: Denies abdominal pain, bloating, constipation, diarrhea or blood in the stool.  GU: Denies urgency, frequency, pain with urination, burning sensation, blood in urine, odor or discharge. Musculoskeletal: Denies decrease in range of motion, difficulty with gait, muscle pain or joint pain and swelling.  Skin: Denies redness, rashes, lesions  or ulcercations.  Neurological: Denies dizziness, difficulty with memory, difficulty with speech or problems with balance and coordination.  Psych: Denies anxiety, depression, SI/HI.  No other specific complaints in a complete review of systems (except as listed in HPI above).  Objective:   Physical Exam   There were no vitals taken for this visit. Wt Readings from Last 3 Encounters:  10/23/20 270 lb 9.6 oz (122.7 kg)  09/12/20 270 lb 3.2 oz (122.6 kg)  06/19/20 270 lb 3.2 oz (122.6 kg)    General: Appears their stated age, well developed, well nourished in NAD. Skin: Warm, dry and intact. No rashes, lesions or ulcerations noted. HEENT:  Head: normal shape and size; Eyes: sclera white, no icterus, conjunctiva pink, PERRLA and EOMs intact; Ears: Tm's gray and intact, normal light reflex; Nose: mucosa pink and moist, septum midline; Throat/Mouth: Teeth present, mucosa pink and moist, no exudate, lesions or ulcerations noted.  Neck:  Neck supple, trachea midline. No masses, lumps or thyromegaly present.  Cardiovascular: Normal rate and rhythm. S1,S2 noted.  No murmur, rubs or gallops noted. No JVD or BLE edema. No carotid bruits noted. Pulmonary/Chest: Normal effort and positive vesicular breath sounds. No respiratory distress. No wheezes, rales or ronchi noted.  Abdomen: Soft and nontender. Normal bowel sounds. No distention or masses noted. Liver, spleen and kidneys non palpable. Musculoskeletal: Normal range of motion. No signs of joint swelling. No difficulty with gait.  Neurological: Alert and oriented. Cranial nerves II-XII grossly intact. Coordination normal.  Psychiatric: Mood and affect normal. Behavior is normal. Judgment and thought content normal.   EKG:  BMET    Component Value Date/Time   NA 143 04/10/2020 0845   NA 138 05/04/2012 0311   K 4.0 04/10/2020 0845   K 3.6 05/04/2012 0311   CL 101 04/10/2020 0845   CL 100 05/04/2012 0311   CO2 36 (H) 04/10/2020 0845   CO2 35 (H) 05/04/2012 0311   GLUCOSE 98 04/10/2020 0845   GLUCOSE 122 (H) 05/04/2012 0311   BUN 17 04/10/2020 0845   BUN 14 05/04/2012 0311   CREATININE 0.78 04/10/2020 0845   CALCIUM 8.8 04/10/2020 0845   CALCIUM 8.9 05/04/2012 0311   GFRNONAA 70 04/10/2020 0845   GFRAA 81 04/10/2020 0845    Lipid Panel     Component Value Date/Time   CHOL 142 07/19/2019 0905   CHOL 157 05/04/2012 0311   TRIG 107 07/19/2019 0905   TRIG 93 05/04/2012 0311   HDL 51 07/19/2019 0905   HDL 58 05/04/2012 0311   CHOLHDL 2.8 07/19/2019 0905   VLDL 19 05/04/2012 0311   LDLCALC 72 07/19/2019 0905   LDLCALC 80 05/04/2012 0311    CBC    Component Value  Date/Time   WBC 6.8 04/10/2020 0845   RBC 4.84 04/10/2020 0845   HGB 13.6 04/10/2020 0845   HGB 13.6 05/04/2012 0311   HCT 44.5 04/10/2020 0845   HCT 41.5 05/04/2012 0311   PLT 190 04/10/2020 0845   PLT 209 05/04/2012 0311   MCV 91.9 04/10/2020 0845   MCV 90 05/04/2012 0311   MCH 28.1 04/10/2020 0845   MCHC 30.6 (L) 04/10/2020 0845   RDW 12.9 04/10/2020 0845   RDW 14.7 (H) 05/04/2012 0311   LYMPHSABS 2,108 04/10/2020 0845   LYMPHSABS 2.4 05/04/2012 0311   MONOABS 0.7 05/04/2012 0311   EOSABS 82 04/10/2020 0845   EOSABS 0.0 05/04/2012 0311   BASOSABS 20 04/10/2020 0845   BASOSABS 0.1 05/04/2012 8588  Hgb A1C Lab Results  Component Value Date   HGBA1C 6.2 (A) 04/10/2020   HGBA1C 6.2 04/10/2020           Assessment & Plan:    Nicki Reaper, NP This visit occurred during the SARS-CoV-2 public health emergency.  Safety protocols were in place, including screening questions prior to the visit, additional usage of staff PPE, and extensive cleaning of exam room while observing appropriate contact time as indicated for disinfecting solutions.

## 2021-02-26 ENCOUNTER — Emergency Department: Payer: Medicare Other

## 2021-02-26 ENCOUNTER — Observation Stay
Admission: EM | Admit: 2021-02-26 | Discharge: 2021-02-27 | Disposition: A | Discharge status: Home / Self Care | Payer: Medicare Other | Attending: Internal Medicine | Admitting: Internal Medicine

## 2021-02-26 ENCOUNTER — Other Ambulatory Visit: Payer: Self-pay

## 2021-02-26 DIAGNOSIS — N390 Urinary tract infection, site not specified: Secondary | ICD-10-CM

## 2021-02-26 DIAGNOSIS — Z9989 Dependence on other enabling machines and devices: Secondary | ICD-10-CM

## 2021-02-26 DIAGNOSIS — I10 Essential (primary) hypertension: Secondary | ICD-10-CM

## 2021-02-26 DIAGNOSIS — R4182 Altered mental status, unspecified: Secondary | ICD-10-CM | POA: Insufficient documentation

## 2021-02-26 DIAGNOSIS — G9341 Metabolic encephalopathy: Secondary | ICD-10-CM

## 2021-02-26 DIAGNOSIS — G459 Transient cerebral ischemic attack, unspecified: Secondary | ICD-10-CM

## 2021-02-26 DIAGNOSIS — R413 Other amnesia: Secondary | ICD-10-CM | POA: Diagnosis not present

## 2021-02-26 DIAGNOSIS — I6521 Occlusion and stenosis of right carotid artery: Secondary | ICD-10-CM | POA: Diagnosis not present

## 2021-02-26 DIAGNOSIS — Z6841 Body Mass Index (BMI) 40.0 and over, adult: Secondary | ICD-10-CM | POA: Diagnosis not present

## 2021-02-26 DIAGNOSIS — J811 Chronic pulmonary edema: Secondary | ICD-10-CM | POA: Diagnosis not present

## 2021-02-26 DIAGNOSIS — I509 Heart failure, unspecified: Secondary | ICD-10-CM | POA: Diagnosis not present

## 2021-02-26 DIAGNOSIS — R0902 Hypoxemia: Secondary | ICD-10-CM | POA: Diagnosis not present

## 2021-02-26 DIAGNOSIS — I517 Cardiomegaly: Secondary | ICD-10-CM | POA: Diagnosis not present

## 2021-02-26 DIAGNOSIS — R0600 Dyspnea, unspecified: Secondary | ICD-10-CM | POA: Diagnosis not present

## 2021-02-26 DIAGNOSIS — R7302 Impaired glucose tolerance (oral): Secondary | ICD-10-CM | POA: Diagnosis not present

## 2021-02-26 DIAGNOSIS — Z9181 History of falling: Secondary | ICD-10-CM | POA: Diagnosis not present

## 2021-02-26 DIAGNOSIS — Z20822 Contact with and (suspected) exposure to covid-19: Secondary | ICD-10-CM | POA: Diagnosis not present

## 2021-02-26 DIAGNOSIS — R404 Transient alteration of awareness: Secondary | ICD-10-CM | POA: Diagnosis not present

## 2021-02-26 DIAGNOSIS — R410 Disorientation, unspecified: Secondary | ICD-10-CM | POA: Diagnosis not present

## 2021-02-26 DIAGNOSIS — Z743 Need for continuous supervision: Secondary | ICD-10-CM | POA: Diagnosis not present

## 2021-02-26 DIAGNOSIS — E66813 Obesity, class 3: Secondary | ICD-10-CM

## 2021-02-26 DIAGNOSIS — B9689 Other specified bacterial agents as the cause of diseases classified elsewhere: Secondary | ICD-10-CM | POA: Diagnosis not present

## 2021-02-26 HISTORY — DX: Essential (primary) hypertension: I10

## 2021-02-26 LAB — URINALYSIS, COMPLETE (UACMP) WITH MICROSCOPIC
Bilirubin Urine: NEGATIVE
Glucose, UA: NEGATIVE mg/dL
Hgb urine dipstick: NEGATIVE
Ketones, ur: 20 mg/dL — AB
Nitrite: NEGATIVE
Protein, ur: NEGATIVE mg/dL
Specific Gravity, Urine: 1.021 (ref 1.005–1.030)
pH: 5 (ref 5.0–8.0)

## 2021-02-26 LAB — TROPONIN I (HIGH SENSITIVITY)
Troponin I (High Sensitivity): 6 ng/L (ref <18)
Troponin I (High Sensitivity): 6 ng/L (ref <18)

## 2021-02-26 LAB — CBC WITH DIFFERENTIAL/PLATELET
Abs Immature Granulocytes: 0.03 10*3/uL (ref 0.00–0.07)
Basophils Absolute: 0 10*3/uL (ref 0.0–0.1)
Basophils Relative: 0 %
Eosinophils Absolute: 0.1 10*3/uL (ref 0.0–0.5)
Eosinophils Relative: 1 %
HCT: 47.6 % — ABNORMAL HIGH (ref 36.0–46.0)
Hemoglobin: 15.1 g/dL — ABNORMAL HIGH (ref 12.0–15.0)
Immature Granulocytes: 0 %
Lymphocytes Relative: 32 %
Lymphs Abs: 3.3 10*3/uL (ref 0.7–4.0)
MCH: 28.4 pg (ref 26.0–34.0)
MCHC: 31.7 g/dL (ref 30.0–36.0)
MCV: 89.5 fL (ref 80.0–100.0)
Monocytes Absolute: 0.7 10*3/uL (ref 0.1–1.0)
Monocytes Relative: 7 %
Neutro Abs: 6 10*3/uL (ref 1.7–7.7)
Neutrophils Relative %: 60 %
Platelets: 214 10*3/uL (ref 150–400)
RBC: 5.32 MIL/uL — ABNORMAL HIGH (ref 3.87–5.11)
RDW: 13.5 % (ref 11.5–15.5)
WBC: 10.1 10*3/uL (ref 4.0–10.5)
nRBC: 0 % (ref 0.0–0.2)

## 2021-02-26 LAB — COMPREHENSIVE METABOLIC PANEL
ALT: 14 U/L (ref 0–44)
AST: 29 U/L (ref 15–41)
Albumin: 3.8 g/dL (ref 3.5–5.0)
Alkaline Phosphatase: 123 U/L (ref 38–126)
Anion gap: 8 (ref 5–15)
BUN: 21 mg/dL (ref 8–23)
CO2: 27 mmol/L (ref 22–32)
Calcium: 10 mg/dL (ref 8.9–10.3)
Chloride: 103 mmol/L (ref 98–111)
Creatinine, Ser: 0.98 mg/dL (ref 0.44–1.00)
GFR, Estimated: 57 mL/min — ABNORMAL LOW (ref >60)
Glucose, Bld: 106 mg/dL — ABNORMAL HIGH (ref 70–99)
Potassium: 4 mmol/L (ref 3.5–5.1)
Sodium: 138 mmol/L (ref 135–145)
Total Bilirubin: 0.9 mg/dL (ref 0.3–1.2)
Total Protein: 7.9 g/dL (ref 6.5–8.1)

## 2021-02-26 LAB — BRAIN NATRIURETIC PEPTIDE: B Natriuretic Peptide: 54.3 pg/mL (ref 0.0–100.0)

## 2021-02-26 LAB — RESP PANEL BY RT-PCR (FLU A&B, COVID) ARPGX2
Influenza A by PCR: NEGATIVE
Influenza B by PCR: NEGATIVE
SARS Coronavirus 2 by RT PCR: NEGATIVE

## 2021-02-26 MED ORDER — ACETAMINOPHEN 650 MG RE SUPP: 650.0000 mg | RECTAL | Status: DC | PRN

## 2021-02-26 MED ORDER — ENOXAPARIN SODIUM 60 MG/0.6ML IJ SOSY
0.5000 mg/kg | PREFILLED_SYRINGE | INTRAMUSCULAR | Status: DC
  Administered 2021-02-27: 60 mg via SUBCUTANEOUS
  Filled 2021-02-26: qty 0.6

## 2021-02-26 MED ORDER — ASPIRIN 300 MG RE SUPP: 300.0000 mg | Freq: Every day | RECTAL | Status: DC

## 2021-02-26 MED ORDER — ASPIRIN 325 MG PO TABS
325.0000 mg | ORAL_TABLET | Freq: Every day | ORAL | Status: DC
  Administered 2021-02-27: 325 mg via ORAL
  Filled 2021-02-26: qty 1

## 2021-02-26 MED ORDER — ACETAMINOPHEN 325 MG PO TABS: 650.0000 mg | ORAL_TABLET | ORAL | Status: DC | PRN

## 2021-02-26 MED ORDER — SODIUM CHLORIDE 0.9 % IV SOLN
1.0000 g | Freq: Once | INTRAVENOUS | Status: AC
  Administered 2021-02-26: 1 g via INTRAVENOUS
  Filled 2021-02-26: qty 10

## 2021-02-26 MED ORDER — ACETAMINOPHEN 160 MG/5ML PO SOLN
650.0000 mg | ORAL | Status: DC | PRN
  Filled 2021-02-26: qty 20.3

## 2021-02-26 NOTE — Progress Notes (Signed)
PHARMACIST - PHYSICIAN COMMUNICATION  CONCERNING:  Enoxaparin (Lovenox) for DVT Prophylaxis    RECOMMENDATION: Patient was prescribed enoxaprin 40mg  q24 hours for VTE prophylaxis.   Filed Weights   02/26/21 1756  Weight: 120.1 kg (264 lb 12.4 oz)    Body mass index is 45.45 kg/m.  Estimated Creatinine Clearance: 55.5 mL/min (by C-G formula based on SCr of 0.98 mg/dL).   Based on [facility] policy patient is candidate for enoxaparin 0.5mg /kg TBW SQ every 24 hours based on BMI being >30.  DESCRIPTION: Pharmacy has adjusted enoxaparin dose per [facility] policy.  Patient is now receiving enoxaparin 0.5 mg/kg every 24 hours   [facility], PharmD, [facility] 02/26/2021 11:27 PM

## 2021-02-26 NOTE — ED Provider Notes (Signed)
[facility] Emergency Department Provider Note   ____________________________________________   Event Date/Time   First MD Initiated Contact with Patient 02/26/21 1757     (approximate)  I have reviewed the triage vital signs and the nursing notes.   HISTORY  Chief Complaint Altered Mental Status    HPI Samantha Cooper is a 86 y.o. female with unknown past medical history who presents via EMS for altered mental status for the last 2 days.  Per EMS patient's family states that she is "not acting normally" for the last 3-4 days.  Per EMS, patient was following commands and is oriented only to self.  Patient not answering questions appropriately and therefore further history and review of systems unable to be assessed at this time.  Unknown if family is on their way to provide more history         No past medical history on file.  Patient Active Problem List   Diagnosis Date Noted  . AMS (altered mental status) 02/26/2021  . HTN (hypertension) 02/26/2021  . Obesity, Class III, BMI 40-49.9 (morbid obesity) (HCC) 02/26/2021  . Acute metabolic encephalopathy 02/26/2021  . Walker as ambulation aid 02/26/2021  . UTI (urinary tract infection) 02/26/2021      Prior to Admission medications   Not on File    Allergies Patient has no known allergies.  No family history on file.  Social History    Review of Systems Unable to assess secondary to mental status ____________________________________________   PHYSICAL EXAM:  VITAL SIGNS: ED Triage Vitals  Enc Vitals Group     BP 02/26/21 1755 (!) 151/87     Pulse Rate 02/26/21 1755 99     Resp 02/26/21 1801 (!) 23     Temp 02/26/21 1758 97.6 F (36.4 C)     Temp Source 02/26/21 1758 Oral     SpO2 02/26/21 1755 96 %     Weight 02/26/21 1756 264 lb 12.4 oz (120.1 kg)     Height 02/26/21 1756 5\' 4"  (1.626 m)     Head Circumference --      Peak Flow --      Pain Score --      Pain Loc --       Pain Edu? --      Excl. in GC? --    Constitutional: Alert and disoriented. Well appearing and in no acute distress. Eyes: Conjunctivae are normal. PERRL. Head: Atraumatic. Nose: No congestion/rhinnorhea. Mouth/Throat: Mucous membranes are moist. Neck: No stridor Cardiovascular: Grossly normal heart sounds.  Good peripheral circulation. Respiratory: Normal respiratory effort.  No retractions. Gastrointestinal: Soft and nontender. No distention. Musculoskeletal: No obvious deformities Neurologic: Slow speech and normal language. No gross focal neurologic deficits are appreciated. Skin:  Skin is warm and dry. No rash noted. Psychiatric: Mood and affect are normal. Speech and behavior are normal.  ____________________________________________   LABS (all labs ordered are listed, but only abnormal results are displayed)  Labs Reviewed  COMPREHENSIVE METABOLIC PANEL - Abnormal; Notable for the following components:      Result Value   Glucose, Bld 106 (*)    GFR, Estimated 57 (*)    All other components within normal limits  CBC WITH DIFFERENTIAL/PLATELET - Abnormal; Notable for the following components:   RBC 5.32 (*)    Hemoglobin 15.1 (*)    HCT 47.6 (*)    All other components within normal limits  URINALYSIS, COMPLETE (UACMP) WITH MICROSCOPIC - Abnormal; Notable for the following  components:   Color, Urine YELLOW (*)    APPearance HAZY (*)    Ketones, ur 20 (*)    Leukocytes,Ua SMALL (*)    Bacteria, UA RARE (*)    All other components within normal limits  URINE CULTURE  RESP PANEL BY RT-PCR (FLU A&B, COVID) ARPGX2  BRAIN NATRIURETIC PEPTIDE  HEMOGLOBIN A1C  LIPID PANEL  CBC  CREATININE, SERUM  TROPONIN I (HIGH SENSITIVITY)  TROPONIN I (HIGH SENSITIVITY)   ____________________________________________  EKG  ED ECG REPORT I, Merwyn Katos, the attending physician, personally viewed and interpreted this ECG.  Date: 02/26/2021 EKG Time: 1801 Rate:  96 Rhythm: normal sinus rhythm QRS Axis: normal Intervals: normal ST/T Wave abnormalities: normal Narrative Interpretation: no evidence of acute ischemia  ____________________________________________  RADIOLOGY  ED MD interpretation:  Single view portable x-ray of the chest shows mild pulmonary edema  CT without contrast of the head shows no evidence of acute abnormalities including no intracranial hemorrhage  Official radiology report(s): CT Head Wo Contrast  Result Date: 02/26/2021 CLINICAL DATA:  Altered mental status. EXAM: CT HEAD WITHOUT CONTRAST TECHNIQUE: Contiguous axial images were obtained from the base of the skull through the vertex without intravenous contrast. COMPARISON:  July 16, 2009 FINDINGS: Brain: There is moderate severity cerebral atrophy with widening of the extra-axial spaces and ventricular dilatation. There are areas of decreased attenuation within the white matter tracts of the supratentorial brain, consistent with microvascular disease changes. Tiny chronic bilateral basal ganglia lacunar infarcts are seen. Vascular: No hyperdense vessel or unexpected calcification. Skull: Bilateral posterior parietal burr holes are seen. These are present on the prior exam. Sinuses/Orbits: No acute finding. Other: None. IMPRESSION: 1. Generalized cerebral atrophy. 2. No acute intracranial abnormality. Electronically Signed   By: Aram Candela M.D.   On: 02/26/2021 21:58   DG Chest Port 1 View  Result Date: 02/26/2021 CLINICAL DATA:  Dyspnea EXAM: PORTABLE CHEST 1 VIEW COMPARISON:  05/03/2012 chest radiograph. FINDINGS: Stable cardiomediastinal silhouette with mild cardiomegaly. No pneumothorax. No pleural effusion. Mild pulmonary edema. No acute consolidative airspace disease. IMPRESSION: Mild congestive heart failure. Electronically Signed   By: Delbert Phenix M.D.   On: 02/26/2021 22:02    ____________________________________________   PROCEDURES  Procedure(s)  performed (including Critical Care):  .1-3 Lead EKG Interpretation Performed by: Merwyn Katos, MD Authorized by: Merwyn Katos, MD     Interpretation: normal     ECG rate:  77   ECG rate assessment: normal     Rhythm: sinus rhythm     Ectopy: none     Conduction: normal       ____________________________________________   INITIAL IMPRESSION / ASSESSMENT AND PLAN / ED COURSE  As part of my medical decision making, I reviewed the following data within the electronic MEDICAL RECORD NUMBER Nursing notes reviewed and incorporated, Labs reviewed, EKG interpreted, Old chart reviewed, Radiograph reviewed and Notes from prior ED visits reviewed and incorporated      Presume medical neurocognitive cause such as dementia, delirium, drug/alcohol induced, UTI  Will obtain medical workup and discuss with social work to try to obtain collateral information.  Given History, Physical, and Workup there is no overt concern for a dangerous emergent cause such as, but not limited to, CNS infection, severe Toxidrome, severe metabolic derangement, or stroke. Laboratory evaluation only shows evidence of mild UTI with small amount of white blood cells and rare bacteria.  Will treat empirically given patient's altered mental status Disposition: Admit; the patient is  suffering altered mental status that is persistent and therefore they will be admitted.      ____________________________________________   FINAL CLINICAL IMPRESSION(S) / ED DIAGNOSES  Final diagnoses:  Altered mental status, unspecified altered mental status type  Lower urinary tract infectious disease     ED Discharge Orders    None       Note:  This document was prepared using Dragon voice recognition software and may include unintentional dictation errors.   Merwyn Katos, MD 02/26/21 [phone]

## 2021-02-26 NOTE — ED Triage Notes (Signed)
Pt BIBA from home for AMS x couple days. Per EMS family states pt is "not acting normal" x3-4 days. Per EMS pt can follow commands and is oriented x2. Pt is unable to follow commands and is oriented x1 on arrival to ED. Vitals Stable.

## 2021-02-26 NOTE — ED Notes (Signed)
Patient transported to CT 

## 2021-02-26 NOTE — H&P (Signed)
History and Physical    Samantha Cooper UEA:[id number] DOB: 08/22/1935 DOA: 02/26/2021  PCP: System, Provider Not In   Patient coming from: Home  I have personally briefly reviewed patient's old medical records in [facility]  Chief Complaint: Altered mental status, confusion  HPI: Samantha Cooper is a 86 y.o. female with medical history significant for  HTN, class III obesity, ambulant with a walker and who takes' fluid pills' for unknown condition presenting with altered mental status.  Most of the history is taken from the daughter at the bedside who admits she does not know all of her mother's medical conditions.  History is not available on Care Everywhere.  Daughter states that at baseline, her mother is able to help herself, fix her own breakfast and her children checking on her during the day and states that her mother was not her usual state of health until about 5 days prior when she went to check on her and she appeared to have slurred speech.  When she checked on her on subsequent days she appeared to be her usual self however on the day of arrival when she went to her mother's house she appeared confused.  States that her mother had all her silverware spread over the floor and when she asked her about it her mother appeared to be confused and with slurred speech  and for this reason she brought her to the emergency room.  Daughter says her mother has never had a stroke or any recent or past illnesses that caused her to be hospitalized that she knows about.  States she had a fall about 2 months prior but had no problems after the fall. ED course: On arrival, afebrile, BP 153/93, pulse 86 with respirations 29 and O2 sat 97% on room air.  Blood work unremarkable with normal CBC and CMP, normal troponin and BNP.  Urinalysis with some ketones small leukocytes and rare bacteria EKG, personally viewed and interpreted: Normal sinus rhythm at 96 with nonspecific ST-T wave changes Imaging:  Chest x-ray mild CHF CT head: Generalized cerebral atrophy with no acute intracranial abnormality  Patient was treated with Rocephin in the ER for possible UTI.  Hospitalist consulted for admission.    Review of Systems: Unable to obtain due to confusion   Past Medical History:  Diagnosis Date  . HTN (hypertension)     History reviewed. No pertinent surgical history.   reports that she has never smoked. She has never used smokeless tobacco. She reports previous alcohol use. No history on file for drug use.  No Known Allergies  History reviewed. No pertinent family history.    Prior to Admission medications   Not on File    Physical Exam: Vitals:   02/26/21 1930 02/26/21 2000 02/26/21 2100 02/26/21 2252  BP: (!) 156/113 (!) 159/87 (!) 127/101 (!) 164/74  Pulse: 80 81 78 78  Resp: 20 18 (!) 23 (!) 22  Temp:      TempSrc:      SpO2: 96% 97% 95% 95%  Weight:      Height:         Vitals:   02/26/21 1930 02/26/21 2000 02/26/21 2100 02/26/21 2252  BP: (!) 156/113 (!) 159/87 (!) 127/101 (!) 164/74  Pulse: 80 81 78 78  Resp: 20 18 (!) 23 (!) 22  Temp:      TempSrc:      SpO2: 96% 97% 95% 95%  Weight:      Height:  Constitutional: Alert and oriented x 2 . Not in any apparent distress HEENT:      Head: Normocephalic and atraumatic.         Eyes: PERLA, EOMI, Conjunctivae are normal. Sclera is non-icteric.       Mouth/Throat: Mucous membranes are moist.       Neck: Supple with no signs of meningismus. Cardiovascular: Regular rate and rhythm. No murmurs, gallops, or rubs. 2+ symmetrical distal pulses are present . No JVD. No LE edema Respiratory: Respiratory effort normal .Lungs sounds clear bilaterally. No wheezes, crackles, or rhonchi.  Gastrointestinal: Soft, non tender, and non distended with positive bowel sounds.  Genitourinary: No CVA tenderness. Musculoskeletal: Nontender with normal range of motion in all extremities. No cyanosis, or erythema of  extremities. Neurologic:  Face is symmetric. Moving all extremities. No gross focal neurologic deficits . Skin: Skin is warm, dry.  No rash or ulcers Psychiatric: Mood and affect are normal    Labs on Admission: I have personally reviewed following labs and imaging studies  CBC: Recent Labs  Lab 02/26/21 1802  WBC 10.1  NEUTROABS 6.0  HGB 15.1*  HCT 47.6*  MCV 89.5  PLT 214   Basic Metabolic Panel: Recent Labs  Lab 02/26/21 1802  NA 138  K 4.0  CL 103  CO2 27  GLUCOSE 106*  BUN 21  CREATININE 0.98  CALCIUM 10.0   GFR: Estimated Creatinine Clearance: 55.5 mL/min (by C-G formula based on SCr of 0.98 mg/dL). Liver Function Tests: Recent Labs  Lab 02/26/21 1802  AST 29  ALT 14  ALKPHOS 123  BILITOT 0.9  PROT 7.9  ALBUMIN 3.8   No results for input(s): LIPASE, AMYLASE in the last 168 hours. No results for input(s): AMMONIA in the last 168 hours. Coagulation Profile: No results for input(s): INR, PROTIME in the last 168 hours. Cardiac Enzymes: No results for input(s): CKTOTAL, CKMB, CKMBINDEX, TROPONINI in the last 168 hours. BNP (last 3 results) No results for input(s): PROBNP in the last 8760 hours. HbA1C: No results for input(s): HGBA1C in the last 72 hours. CBG: No results for input(s): GLUCAP in the last 168 hours. Lipid Profile: No results for input(s): CHOL, HDL, LDLCALC, TRIG, CHOLHDL, LDLDIRECT in the last 72 hours. Thyroid Function Tests: No results for input(s): TSH, T4TOTAL, FREET4, T3FREE, THYROIDAB in the last 72 hours. Anemia Panel: No results for input(s): VITAMINB12, FOLATE, FERRITIN, TIBC, IRON, RETICCTPCT in the last 72 hours. Urine analysis:    Component Value Date/Time   COLORURINE YELLOW (A) 02/26/2021 1802   APPEARANCEUR HAZY (A) 02/26/2021 1802   LABSPEC 1.021 02/26/2021 1802   PHURINE 5.0 02/26/2021 1802   GLUCOSEU NEGATIVE 02/26/2021 1802   HGBUR NEGATIVE 02/26/2021 1802   BILIRUBINUR NEGATIVE 02/26/2021 1802   KETONESUR 20  (A) 02/26/2021 1802   PROTEINUR NEGATIVE 02/26/2021 1802   NITRITE NEGATIVE 02/26/2021 1802   LEUKOCYTESUR SMALL (A) 02/26/2021 1802    Radiological Exams on Admission: CT Head Wo Contrast  Result Date: 02/26/2021 CLINICAL DATA:  Altered mental status. EXAM: CT HEAD WITHOUT CONTRAST TECHNIQUE: Contiguous axial images were obtained from the base of the skull through the vertex without intravenous contrast. COMPARISON:  July 16, 2009 FINDINGS: Brain: There is moderate severity cerebral atrophy with widening of the extra-axial spaces and ventricular dilatation. There are areas of decreased attenuation within the white matter tracts of the supratentorial brain, consistent with microvascular disease changes. Tiny chronic bilateral basal ganglia lacunar infarcts are seen. Vascular: No hyperdense vessel or unexpected  calcification. Skull: Bilateral posterior parietal burr holes are seen. These are present on the prior exam. Sinuses/Orbits: No acute finding. Other: None. IMPRESSION: 1. Generalized cerebral atrophy. 2. No acute intracranial abnormality. Electronically Signed   By: Aram Candela M.D.   On: 02/26/2021 21:58   DG Chest Port 1 View  Result Date: 02/26/2021 CLINICAL DATA:  Dyspnea EXAM: PORTABLE CHEST 1 VIEW COMPARISON:  05/03/2012 chest radiograph. FINDINGS: Stable cardiomediastinal silhouette with mild cardiomegaly. No pneumothorax. No pleural effusion. Mild pulmonary edema. No acute consolidative airspace disease. IMPRESSION: Mild congestive heart failure. Electronically Signed   By: Delbert Phenix M.D.   On: 02/26/2021 22:02     Assessment/Plan 86 year old female with history of HTN, class III obesity, ambulant with a walker and who takes' fluid pills' for unknown condition presenting with intermittent episodes of confusion and slurred speech x5 days.     Acute metabolic encephalopathy   Possible TIA - Patient who at baseline, ambulates with walker and fairly independent in the home  presenting with a 5-day history of intermittent confusion and slurred speech - Head CT showing cerebral atrophy with no acute intracranial abnormality - Limited baseline history available on patient, but no prior history of memory problems or confusion - We will get a stroke work-up given absence of other explanation - Cardiac monitoring, carotid Doppler and echocardiogram - Fall and aspiration precautions -PT, OT and speech therapy eval - Neurochecks PT - Neurology consult  Possible UTI (urinary tract infection) - Urinalysis minimally abnormal - Follow cultures - Patient received Rocephin in the ER    HTN (hypertension) - We will hold antihypertensives to allow for permissive hypertension in case this is TIA/CVA    Obesity, Class III, BMI 40-49.9 (morbid obesity) (HCC) - Complicating factor to overall prognosis and care    Walker as ambulation aid/history of fall - Patient usually walks with walker and had a history of a fall 2 months prior - Consider PT eval    DVT prophylaxis: Lovenox  Code Status: full code  Family Communication: Daughter. Disposition Plan: Back to previous home environment Consults called: none  Status: Observation    Andris Baumann MD [facility]     02/26/2021, 11:21 PM

## 2021-02-27 ENCOUNTER — Observation Stay: Payer: Medicare Other

## 2021-02-27 ENCOUNTER — Encounter: Payer: Self-pay | Admitting: Internal Medicine

## 2021-02-27 ENCOUNTER — Ambulatory Visit: Payer: Self-pay | Admitting: Internal Medicine

## 2021-02-27 ENCOUNTER — Encounter: Payer: Self-pay | Admitting: Family Medicine

## 2021-02-27 ENCOUNTER — Observation Stay
Admit: 2021-02-27 | Discharge: 2021-02-27 | Disposition: A | Discharge status: Home / Self Care | Payer: Medicare Other | Attending: Internal Medicine | Admitting: Internal Medicine

## 2021-02-27 DIAGNOSIS — R7301 Impaired fasting glucose: Secondary | ICD-10-CM

## 2021-02-27 DIAGNOSIS — N3000 Acute cystitis without hematuria: Secondary | ICD-10-CM

## 2021-02-27 DIAGNOSIS — R413 Other amnesia: Secondary | ICD-10-CM | POA: Diagnosis not present

## 2021-02-27 DIAGNOSIS — I6521 Occlusion and stenosis of right carotid artery: Secondary | ICD-10-CM

## 2021-02-27 DIAGNOSIS — G9341 Metabolic encephalopathy: Secondary | ICD-10-CM

## 2021-02-27 DIAGNOSIS — I10 Essential (primary) hypertension: Secondary | ICD-10-CM

## 2021-02-27 DIAGNOSIS — I671 Cerebral aneurysm, nonruptured: Secondary | ICD-10-CM | POA: Insufficient documentation

## 2021-02-27 DIAGNOSIS — G459 Transient cerebral ischemic attack, unspecified: Secondary | ICD-10-CM | POA: Diagnosis not present

## 2021-02-27 DIAGNOSIS — I6603 Occlusion and stenosis of bilateral middle cerebral arteries: Secondary | ICD-10-CM | POA: Diagnosis not present

## 2021-02-27 DIAGNOSIS — E041 Nontoxic single thyroid nodule: Secondary | ICD-10-CM | POA: Diagnosis not present

## 2021-02-27 DIAGNOSIS — G319 Degenerative disease of nervous system, unspecified: Secondary | ICD-10-CM | POA: Diagnosis not present

## 2021-02-27 DIAGNOSIS — Z8673 Personal history of transient ischemic attack (TIA), and cerebral infarction without residual deficits: Secondary | ICD-10-CM | POA: Diagnosis not present

## 2021-02-27 DIAGNOSIS — G450 Vertebro-basilar artery syndrome: Secondary | ICD-10-CM | POA: Diagnosis not present

## 2021-02-27 DIAGNOSIS — I6621 Occlusion and stenosis of right posterior cerebral artery: Secondary | ICD-10-CM | POA: Diagnosis not present

## 2021-02-27 LAB — ECHOCARDIOGRAM COMPLETE
AR max vel: 3.45 cm2
AV Area VTI: 4.5 cm2
AV Area mean vel: 3.56 cm2
AV Mean grad: 2 mmHg
AV Peak grad: 2.9 mmHg
Ao pk vel: 0.85 m/s
Area-P 1/2: 2.73 cm2
Height: 64 in
S' Lateral: 2.37 cm
Weight: 4236.36 oz

## 2021-02-27 LAB — HEMOGLOBIN A1C
Hgb A1c MFr Bld: 5.8 % — ABNORMAL HIGH (ref 4.8–5.6)
Mean Plasma Glucose: 119.76 mg/dL

## 2021-02-27 LAB — LIPID PANEL
Cholesterol: 181 mg/dL (ref 0–200)
HDL: 65 mg/dL (ref >40)
LDL Cholesterol: 106 mg/dL — ABNORMAL HIGH (ref 0–99)
Total CHOL/HDL Ratio: 2.8 RATIO
Triglycerides: 51 mg/dL (ref <150)
VLDL: 10 mg/dL (ref 0–40)

## 2021-02-27 LAB — URINE DRUG SCREEN, QUALITATIVE (ARMC ONLY)
Amphetamines, Ur Screen: NOT DETECTED
Barbiturates, Ur Screen: NOT DETECTED
Benzodiazepine, Ur Scrn: NOT DETECTED
Cannabinoid 50 Ng, Ur ~~LOC~~: NOT DETECTED
Cocaine Metabolite,Ur ~~LOC~~: NOT DETECTED
MDMA (Ecstasy)Ur Screen: NOT DETECTED
Methadone Scn, Ur: NOT DETECTED
Opiate, Ur Screen: NOT DETECTED
Phencyclidine (PCP) Ur S: NOT DETECTED
Tricyclic, Ur Screen: NOT DETECTED

## 2021-02-27 LAB — VITAMIN B12: Vitamin B-12: 168 pg/mL — ABNORMAL LOW (ref 180–914)

## 2021-02-27 MED ORDER — CLOPIDOGREL BISULFATE 75 MG PO TABS
75.0000 mg | ORAL_TABLET | Freq: Every day | ORAL | Status: DC
  Administered 2021-02-27: 75 mg via ORAL
  Filled 2021-02-27: qty 1

## 2021-02-27 MED ORDER — LOSARTAN POTASSIUM 50 MG PO TABS
25.0000 mg | ORAL_TABLET | Freq: Every day | ORAL | Status: DC
  Administered 2021-02-27: 25 mg via ORAL
  Filled 2021-02-27: qty 1

## 2021-02-27 MED ORDER — CEPHALEXIN 500 MG PO CAPS: 500.0000 mg | ORAL_CAPSULE | Freq: Three times a day (TID) | ORAL | 0 refills | Status: AC

## 2021-02-27 MED ORDER — PANTOPRAZOLE SODIUM 40 MG PO TBEC
40.0000 mg | DELAYED_RELEASE_TABLET | Freq: Every day | ORAL | Status: DC
  Administered 2021-02-27: 40 mg via ORAL
  Filled 2021-02-27: qty 1

## 2021-02-27 MED ORDER — CEPHALEXIN 500 MG PO CAPS
500.0000 mg | ORAL_CAPSULE | Freq: Three times a day (TID) | ORAL | Status: DC
  Administered 2021-02-27: 500 mg via ORAL
  Filled 2021-02-27: qty 1

## 2021-02-27 MED ORDER — ATORVASTATIN CALCIUM 20 MG PO TABS
40.0000 mg | ORAL_TABLET | Freq: Every day | ORAL | Status: DC
  Administered 2021-02-27: 40 mg via ORAL
  Filled 2021-02-27: qty 2

## 2021-02-27 MED ORDER — IOHEXOL 350 MG/ML SOLN
75.0000 mL | Freq: Once | INTRAVENOUS | Status: AC | PRN
  Administered 2021-02-27: 75 mL via INTRAVENOUS

## 2021-02-27 MED ORDER — TORSEMIDE 20 MG PO TABS: 20.0000 mg | ORAL_TABLET | Freq: Every day | ORAL | 0 refills | Status: AC

## 2021-02-27 NOTE — Progress Notes (Deleted)
Subjective:    Patient ID: Samantha Cooper, female    DOB: [birth year], 86 y.o.   MRN: [id number]  HPI  Patient presents the clinic today for form completion.  She has a form from the [facility] [employer]  requesting necessity for a live-in aide  Review of Systems      Past Medical History:  Diagnosis Date  . Heart disease   . History of heart attack   . Hyperlipidemia   . Osteoarthritis 09/26/2017  . Oxygen deficiency     Current Outpatient Medications  Medication Sig Dispense Refill  . aspirin 81 MG tablet aspirin 81 mg tablet,delayed release  take 1 tablet by mouth once daily    . atorvastatin (LIPITOR) 40 MG tablet Take 1 tablet daily 90 tablet 0  . carvedilol (COREG) 3.125 MG tablet TAKE 1 TABLET(3.125 MG) BY MOUTH TWICE DAILY WITH A MEAL 180 tablet 2  . clopidogrel (PLAVIX) 75 MG tablet Take 1 tablet (75 mg total) by mouth daily. 90 tablet 1  . dexlansoprazole (DEXILANT) 60 MG capsule Take 1 capsule (60 mg total) by mouth daily. 90 capsule 0  . losartan (COZAAR) 25 MG tablet Take 1 tablet (25 mg total) by mouth daily. 90 tablet 1  . torsemide (DEMADEX) 100 MG tablet TAKE 1 TABLET(100 MG) BY MOUTH DAILY 90 tablet 1   No current facility-administered medications for this visit.    Allergies  Allergen Reactions  . Codeine Itching    Family History  Problem Relation Age of Onset  . Heart disease Mother     Social History   Socioeconomic History  . Marital status: Divorced    Spouse name: Not on file  . Number of children: Not on file  . Years of education: Not on file  . Highest education level: 7th grade  Occupational History  . Not on file  Tobacco Use  . Smoking status: Former Smoker    Packs/day: 1.50    Years: 10.00    Pack years: 15.00  . Smokeless tobacco: Former Neurosurgeon    Types: Snuff  . Tobacco comment: unsure when she quit, over 30 years ago   Vaping Use  . Vaping Use: Not on file  Substance and Sexual Activity  . Alcohol use: No  .  Drug use: No  . Sexual activity: Not on file  Other Topics Concern  . Not on file  Social History Narrative   Goes to church, no other social activities. Has family that lives close by. Grandson stays the night every other night or so. Her daughter comes by daily.    Social Determinants of Health   Financial Resource Strain: Not on file  Food Insecurity: Not on file  Transportation Needs: Not on file  Physical Activity: Not on file  Stress: Not on file  Social Connections: Not on file  Intimate Partner Violence: Not on file     Constitutional: Denies fever, malaise, fatigue, headache or abrupt weight changes.  HEENT: Denies eye pain, eye redness, ear pain, ringing in the ears, wax buildup, runny nose, nasal congestion, bloody nose, or sore throat. Respiratory: Denies difficulty breathing, shortness of breath, cough or sputum production.   Cardiovascular: Denies chest pain, chest tightness, palpitations or swelling in the hands or feet.  Gastrointestinal: Denies abdominal pain, bloating, constipation, diarrhea or blood in the stool.  GU: Denies urgency, frequency, pain with urination, burning sensation, blood in urine, odor or discharge. Musculoskeletal: Denies decrease in range of motion, difficulty with gait,  muscle pain or joint pain and swelling.  Skin: Denies redness, rashes, lesions or ulcercations.  Neurological: Denies dizziness, difficulty with memory, difficulty with speech or problems with balance and coordination.  Psych: Denies anxiety, depression, SI/HI.  No other specific complaints in a complete review of systems (except as listed in HPI above).  Objective:   Physical Exam  There were no vitals taken for this visit. Wt Readings from Last 3 Encounters:  10/23/20 270 lb 9.6 oz (122.7 kg)  09/12/20 270 lb 3.2 oz (122.6 kg)  06/19/20 270 lb 3.2 oz (122.6 kg)    General: Appears their stated age, well developed, well nourished in NAD. Skin: Warm, dry and intact.  No rashes, lesions or ulcerations noted. HEENT: Head: normal shape and size; Eyes: sclera white, no icterus, conjunctiva pink, PERRLA and EOMs intact; Ears: Tm's gray and intact, normal light reflex; Nose: mucosa pink and moist, septum midline; Throat/Mouth: Teeth present, mucosa pink and moist, no exudate, lesions or ulcerations noted.  Neck:  Neck supple, trachea midline. No masses, lumps or thyromegaly present.  Cardiovascular: Normal rate and rhythm. S1,S2 noted.  No murmur, rubs or gallops noted. No JVD or BLE edema. No carotid bruits noted. Pulmonary/Chest: Normal effort and positive vesicular breath sounds. No respiratory distress. No wheezes, rales or ronchi noted.  Abdomen: Soft and nontender. Normal bowel sounds. No distention or masses noted. Liver, spleen and kidneys non palpable. Musculoskeletal: Normal range of motion. No signs of joint swelling. No difficulty with gait.  Neurological: Alert and oriented. Cranial nerves II-XII grossly intact. Coordination normal.  Psychiatric: Mood and affect normal. Behavior is normal. Judgment and thought content normal.   EKG:  BMET    Component Value Date/Time   NA 143 04/10/2020 0845   NA 138 05/04/2012 0311   K 4.0 04/10/2020 0845   K 3.6 05/04/2012 0311   CL 101 04/10/2020 0845   CL 100 05/04/2012 0311   CO2 36 (H) 04/10/2020 0845   CO2 35 (H) 05/04/2012 0311   GLUCOSE 98 04/10/2020 0845   GLUCOSE 122 (H) 05/04/2012 0311   BUN 17 04/10/2020 0845   BUN 14 05/04/2012 0311   CREATININE 0.78 04/10/2020 0845   CALCIUM 8.8 04/10/2020 0845   CALCIUM 8.9 05/04/2012 0311   GFRNONAA 70 04/10/2020 0845   GFRAA 81 04/10/2020 0845    Lipid Panel     Component Value Date/Time   CHOL 142 07/19/2019 0905   CHOL 157 05/04/2012 0311   TRIG 107 07/19/2019 0905   TRIG 93 05/04/2012 0311   HDL 51 07/19/2019 0905   HDL 58 05/04/2012 0311   CHOLHDL 2.8 07/19/2019 0905   VLDL 19 05/04/2012 0311   LDLCALC 72 07/19/2019 0905   LDLCALC 80  05/04/2012 0311    CBC    Component Value Date/Time   WBC 6.8 04/10/2020 0845   RBC 4.84 04/10/2020 0845   HGB 13.6 04/10/2020 0845   HGB 13.6 05/04/2012 0311   HCT 44.5 04/10/2020 0845   HCT 41.5 05/04/2012 0311   PLT 190 04/10/2020 0845   PLT 209 05/04/2012 0311   MCV 91.9 04/10/2020 0845   MCV 90 05/04/2012 0311   MCH 28.1 04/10/2020 0845   MCHC 30.6 (L) 04/10/2020 0845   RDW 12.9 04/10/2020 0845   RDW 14.7 (H) 05/04/2012 0311   LYMPHSABS 2,108 04/10/2020 0845   LYMPHSABS 2.4 05/04/2012 0311   MONOABS 0.7 05/04/2012 0311   EOSABS 82 04/10/2020 0845   EOSABS 0.0 05/04/2012 7408  BASOSABS 20 04/10/2020 0845   BASOSABS 0.1 05/04/2012 0311    Hgb A1C Lab Results  Component Value Date   HGBA1C 6.2 (A) 04/10/2020   HGBA1C 6.2 04/10/2020            Assessment & Plan:    Nicki Reaper, NP This visit occurred during the SARS-CoV-2 public health emergency.  Safety protocols were in place, including screening questions prior to the visit, additional usage of staff PPE, and extensive cleaning of exam room while observing appropriate contact time as indicated for disinfecting solutions.

## 2021-02-27 NOTE — ED Notes (Signed)
Pt is not in room to do hourly checks on

## 2021-02-27 NOTE — ED Notes (Signed)
Pts daughter is Pasty Spillers her phone number is [phone].

## 2021-02-27 NOTE — ED Notes (Signed)
Myself and Dr Renae Gloss had a detailed discussion with the patient's family regarding plan of care, all parties verbalize understanding. Awaiting test results to determine plan of care.

## 2021-02-27 NOTE — TOC Initial Note (Signed)
Transition of Care [facility]) - Initial/Assessment Note    Patient Details  Name: Samantha Cooper MRN: [id number] Date of Birth: 10/28/1935  Transition of Care [facility]) CM/SW Contact:    Ova Freshwater Phone Number: [phone] 02/27/2021, 1:55 PM  Clinical Narrative:                  Patient presents to [facility] due to altered mental status for the last 3-4 days. CSW spoke with patient's daughter Cephus Shelling [phone] for collateral information.  CSW explained the role of TOC in patient care.  CSW explained the recommendations from PT and OT and asked if there were specific home health agencies she's used in the past or once she prefers.  Ms. Gorden Harms stated she does not have a preference of home health. Ms. Gorden Harms stated the patient lives at home with her son.  Ms. Gorden Harms stated they are amenable to home health services.  Ms. Gorden Harms state the patient has used [facility]  [phone] thru Medicaid. (Ms. Gorden Harms will need to contact [facility] [phone], and request to switch services to [facility].) Ms. Gorden Harms stated the patient ambulates with a walker or rolling walker, and she is able to manage ADLs without assistance. Patient's PCP is at [facility] in [place] but missed last appointment and has not met new PCP.  Family members take turns the taking the patient to her medical appointments. Patient has medications delivered or her family picks them up, pharmacy is [facility] in [place] explained d/c was pending on results form neurology.  Ms. Gorden Harms verbalized understanding.  CSW contacted Corene Cornea w/ [facility] and will update CW of they are able to accept patient for home health services.   Expected Discharge Plan: [place] Barriers to Discharge: Continued Medical Work up   Patient Goals and CMS Choice Patient states their goals for this hospitalization and ongoing recovery are:: To get back  home.      Expected Discharge Plan and Services Expected Discharge Plan: [place] In-house Referral: Clinical Social Work   Post Acute Care Choice: [place] arrangements for the past 2 months: Apartment Expected Discharge Date: 02/27/21                                    Prior Living Arrangements/Services Living arrangements for the past 2 months: Apartment Lives with:: Adult Children,Other (Comment) (Granddaughter) Patient language and need for interpreter reviewed:: Yes Do you feel safe going back to the place where you live?: Yes      Need for Family Participation in Patient Care: Yes (Comment) Care giver support system in place?: Yes (comment)   Criminal Activity/Legal Involvement Pertinent to Current Situation/Hospitalization: No - Comment as needed  Activities of Daily Living      Permission Sought/Granted Permission sought to share information with : Facility Sport and exercise psychologist Permission granted to share information with : Yes, Verbal Permission Granted  Share Information with NAME: Cephus Shelling Daughter     [phone]           Emotional Assessment Appearance:: Appears stated age Attitude/Demeanor/Rapport: Engaged Affect (typically observed): Stable Orientation: : Oriented to Self,Oriented to Place,Oriented to Situation Alcohol / Substance Use: Not Applicable Psych Involvement: No (comment)  Admission diagnosis:  AMS (altered mental status) [R41.82] Patient Active Problem List   Diagnosis Date Noted  . AMS (altered  mental status) 02/26/2021  . HTN (hypertension) 02/26/2021  . Obesity, Class III, BMI 40-49.9 (morbid obesity) ([place]) 02/26/2021  . Acute metabolic encephalopathy 28/08/8866  . Walker as ambulation aid 02/26/2021  . UTI (urinary tract infection) 02/26/2021   PCP:  System, Provider Not In Pharmacy:   [facility], [place] - [place] AT [facility] [address] [place] [address] Phone: [phone] Fax: [phone]     Social Determinants of Health ([place]) Interventions    Readmission Risk Interventions No flowsheet data found.

## 2021-02-27 NOTE — Discharge Summary (Signed)
[facility] -  at [facility]   PATIENT NAME: Samantha Cooper    MR#:  [id number]  DATE OF BIRTH:  1935-09-03  DATE OF ADMISSION:  02/26/2021 ADMITTING PHYSICIAN: Andris Baumann, MD  DATE OF DISCHARGE: 02/27/2021  3:56 PM  PRIMARY CARE PHYSICIAN: System, Provider Not In    ADMISSION DIAGNOSIS:  AMS (altered mental status) [R41.82]  DISCHARGE DIAGNOSIS:  Principal Problem:   Acute metabolic encephalopathy Active Problems:   HTN (hypertension)   Obesity, Class III, BMI 40-49.9 (morbid obesity) (HCC)   Walker as ambulation aid   UTI (urinary tract infection)   SECONDARY DIAGNOSIS:   Past Medical History:  Diagnosis Date  . Heart disease   . History of heart attack   . HTN (hypertension)   . Hyperlipidemia   . Osteoarthritis 09/26/2017  . Oxygen deficiency     HOSPITAL COURSE:   1.  Acute metabolic encephalopathy.  Likely secondary to either dehydration or UTI.  Patient feeling better today and wanting to go home.  MRI of the brain was negative for acute stroke.  Patient already on aspirin Plavix and atorvastatin. 2.  Right carotid stenosis.  CT angio of the neck.  Showed 60% to 70% stenosis at the origin of the right carotid artery.  Case discussed with neurology and they are okay with outpatient follow-up with vascular surgery.  Patient already on aspirin and Plavix. 3.  Possibility of aneurysms on the brain.  1 to 2 mm ventrally projecting vascular protrusion arising from the mid basilar artery which could reflect a small aneurysm.  2 mm medially projecting vascular protrusion at the origin of the posterior cerebral artery which also could reflect a small aneurysm.  Case discussed with neurology and recommends outpatient follow-up with neurosurgery. 4.  Memory loss.  I am concerned that she may have an underlying dementia.  Vitamin B12, vitamin B1 and RPR pending.  Recommend family taking turns with watching her.  Lives with her son and niece.  We will  set up home health. 5.  Essential hypertension continue Coreg, losartan and torsemide.  We will lower the dose of torsemide.  Echocardiogram showed a normal ejection fraction 6.  Morbid obesity with a BMI of 45.45 7.  Impaired fasting glucose hemoglobin A1c 5.8.  Patient not a diabetic. 8.  Possibility of urinary tract infection.  Given 1 dose of Rocephin in the ER.  I did prescribe a few days of Keflex upon going home.  Urine culture sent off but still pending.   DISCHARGE CONDITIONS:   Satisfactory  CONSULTS OBTAINED:  None  DRUG ALLERGIES:   Allergies  Allergen Reactions  . Codeine Itching    DISCHARGE MEDICATIONS:   Allergies as of 02/27/2021      Reactions   Codeine Itching      Medication List    TAKE these medications   aspirin EC 81 MG tablet Take 81 mg by mouth daily.   atorvastatin 40 MG tablet Commonly known as: LIPITOR Take 40 mg by mouth daily.   carvedilol 3.125 MG tablet Commonly known as: COREG Take 3.125 mg by mouth 2 (two) times daily.   cephALEXin 500 MG capsule Commonly known as: KEFLEX Take 1 capsule (500 mg total) by mouth every 8 (eight) hours for 4 days.   clopidogrel 75 MG tablet Commonly known as: PLAVIX Take 75 mg by mouth daily.   Dexilant 60 MG capsule Generic drug: dexlansoprazole Take 60 mg by mouth daily.   losartan 25 MG tablet  Commonly known as: COZAAR Take 25 mg by mouth daily.   torsemide 20 MG tablet Commonly known as: Demadex Take 1 tablet (20 mg total) by mouth daily. Start taking on: Feb 28, 2021 What changed:   medication strength  how much to take        DISCHARGE INSTRUCTIONS:   Follow-up PMD 5 days  If you experience worsening of your admission symptoms, develop shortness of breath, life threatening emergency, suicidal or homicidal thoughts you must seek medical attention immediately by calling 911 or calling your MD immediately  if symptoms less severe.  You Must read complete  instructions/literature along with all the possible adverse reactions/side effects for all the Medicines you take and that have been prescribed to you. Take any new Medicines after you have completely understood and accept all the possible adverse reactions/side effects.   Please note  You were cared for by a hospitalist during your hospital stay. If you have any questions about your discharge medications or the care you received while you were in the hospital after you are discharged, you can call the unit and asked to speak with the hospitalist on call if the hospitalist that took care of you is not available. Once you are discharged, your primary care physician will handle any further medical issues. Please note that NO REFILLS for any discharge medications will be authorized once you are discharged, as it is imperative that you return to your primary care physician (or establish a relationship with a primary care physician if you do not have one) for your aftercare needs so that they can reassess your need for medications and monitor your lab values.    Today   CHIEF COMPLAINT:   Chief Complaint  Patient presents with  . Altered Mental Status    HISTORY OF PRESENT ILLNESS:  Samantha Cooper  is a 86 y.o. female came in with altered mental status   VITAL SIGNS:  Blood pressure (!) 146/70, pulse 79, temperature 98.4 F (36.9 C), resp. rate 18, height  (1.626 m), weight 120.1 kg, SpO2 97 %.  PHYSICAL EXAMINATION:  GENERAL:  86 y.o.-year-old patient lying in the bed with no acute distress.  EYES: Pupils equal, round, reactive to light and accommodation. No scleral icterus. Extraocular muscles intact.  HEENT: Head atraumatic, normocephalic. Oropharynx and nasopharynx clear.  LUNGS: Normal breath sounds bilaterally, no wheezing, rales,rhonchi or crepitation. No use of accessory muscles of respiration.  CARDIOVASCULAR: S1, S2 normal. No murmurs, rubs, or gallops.  ABDOMEN: Soft,  non-tender, non-distended.  EXTREMITIES: No pedal edema.  NEUROLOGIC: Cranial nerves II through XII are intact. Muscle strength 5/5 in all extremities. Sensation intact. Gait not checked.  PSYCHIATRIC: The patient is alert and answers questions appropriately.  SKIN: No obvious rash, lesion, or ulcer.   DATA REVIEW:   CBC Recent Labs  Lab 02/26/21 1802  WBC 10.1  HGB 15.1*  HCT 47.6*  PLT 214    Chemistries  Recent Labs  Lab 02/26/21 1802  NA 138  K 4.0  CL 103  CO2 27  GLUCOSE 106*  BUN 21  CREATININE 0.98  CALCIUM 10.0  AST 29  ALT 14  ALKPHOS 123  BILITOT 0.9    Microbiology Results  Results for orders placed or performed during the hospital encounter of 02/26/21  Resp Panel by RT-PCR (Flu A&B, Covid) Nasopharyngeal Swab     Status: None   Collection Time: 02/26/21  6:05 PM   Specimen: Nasopharyngeal Swab; Nasopharyngeal(NP) swabs in vial  transport medium  Result Value Ref Range Status   SARS Coronavirus 2 by RT PCR NEGATIVE NEGATIVE Final    Comment: (NOTE) SARS-CoV-2 target nucleic acids are NOT DETECTED.  The SARS-CoV-2 RNA is generally detectable in upper respiratory specimens during the acute phase of infection. The lowest concentration of SARS-CoV-2 viral copies this assay can detect is 138 copies/mL. A negative result does not preclude SARS-Cov-2 infection and should not be used as the sole basis for treatment or other patient management decisions. A negative result may occur with  improper specimen collection/handling, submission of specimen other than nasopharyngeal swab, presence of viral mutation(s) within the areas targeted by this assay, and inadequate number of viral copies(<138 copies/mL). A negative result must be combined with clinical observations, patient history, and epidemiological information. The expected result is Negative.  Fact Sheet for Patients:  BloggerCourse.com  Fact Sheet for Healthcare Providers:   SeriousBroker.it  This test is no t yet approved or cleared by the [place] FDA and  has been authorized for detection and/or diagnosis of SARS-CoV-2 by FDA under an Emergency Use Authorization (EUA). This EUA will remain  in effect (meaning this test can be used) for the duration of the COVID-19 declaration under Section 564(b)(1) of the Act, 21 U.S.C.section 360bbb-3(b)(1), unless the authorization is terminated  or revoked sooner.       Influenza A by PCR NEGATIVE NEGATIVE Final   Influenza B by PCR NEGATIVE NEGATIVE Final    Comment: (NOTE) The Xpert Xpress SARS-CoV-2/FLU/RSV plus assay is intended as an aid in the diagnosis of influenza from Nasopharyngeal swab specimens and should not be used as a sole basis for treatment. Nasal washings and aspirates are unacceptable for Xpert Xpress SARS-CoV-2/FLU/RSV testing.  Fact Sheet for Patients: BloggerCourse.com  Fact Sheet for Healthcare Providers: SeriousBroker.it  This test is not yet approved or cleared by the [place] FDA and has been authorized for detection and/or diagnosis of SARS-CoV-2 by FDA under an Emergency Use Authorization (EUA). This EUA will remain in effect (meaning this test can be used) for the duration of the COVID-19 declaration under Section 564(b)(1) of the Act, 21 U.S.C. section 360bbb-3(b)(1), unless the authorization is terminated or revoked.  Performed at [facility], [address] [place], [place] [address]     RADIOLOGY:  CT ANGIO HEAD NECK W WO CM  Result Date: 02/27/2021 CLINICAL DATA:  Altered mental status, unclear cause. Additional history provided: Altered mental status for several days, unable to follow commands, oriented x1. EXAM: CT ANGIOGRAPHY HEAD AND NECK TECHNIQUE: Multidetector CT imaging of the head and neck was performed using the standard protocol during bolus administration of  intravenous contrast. Multiplanar CT image reconstructions and MIPs were obtained to evaluate the vascular anatomy. Carotid stenosis measurements (when applicable) are obtained utilizing NASCET criteria, using the distal internal carotid diameter as the denominator. CONTRAST:  45mL OMNIPAQUE IOHEXOL 350 MG/ML SOLN COMPARISON:  Brain MRI performed earlier today 02/27/2021. Head CT 02/26/2021. FINDINGS: CT HEAD FINDINGS Brain: Mild cerebral atrophy. Mild to moderate patchy and ill-defined hypoattenuation within the cerebral white matter is nonspecific, but compatible with chronic small vessel ischemic disease. Redemonstrated chronic lacunar infarcts within the bilateral deep gray nuclei. There is no acute intracranial hemorrhage. No demarcated cortical infarct. No extra-axial fluid collection. No evidence of intracranial mass. No midline shift. Vascular: No hyperdense vessel.  Atherosclerotic calcifications Skull: No calvarial fracture. 1.4 cm lucent focus within the right frontoparietal calvarium with asymmetric involvement of the inner table. T2/FLAIR  hyperintense signal and restricted diffusion was present at this site on the brain MRI performed earlier today. Sinuses: No significant paranasal sinus disease. Orbits: No mass or acute finding. Review of the MIP images confirms the above findings CTA NECK FINDINGS Aortic arch: Standard aortic branching. Atherosclerotic plaque within the visualized aortic arch and proximal major branch vessels of the neck. No hemodynamically significant innominate or proximal subclavian artery stenosis. Right carotid system: CCA and ICA patent within the neck. Soft and calcified plaque within the carotid bifurcation and proximal ICA. Apparent stenosis at the origin of the right ICA of 60-70% (however, this apparent stenosis could potentially be exaggerated by blooming artifact from calcified plaque at this site). Minimal calcified plaques also present more distally within the cervical  ICA. Left carotid system: CCA and ICA patent within the neck without significant stenosis (50% or greater). Mild to moderate calcified plaque within the carotid bifurcation and proximal ICA. Mild nonstenotic calcified plaque is also present more distally within the cervical ICA. Vertebral arteries: The right vertebral artery is dominant and patent throughout the neck. Calcified plaque results in mild stenosis at the origin of this vessel. The V1 and V2 segments of the non dominant left vertebral artery are patent. There is progressive non enhancement of the V3 left vertebral artery. The left vertebral artery remains occluded intracranially. Skeleton: Cervical spondylosis. No acute bony abnormality or aggressive osseous lesion. Cervicothoracic levocurvature, which may be positional. Other neck: No neck mass. Calcified cervical lymph nodes, likely reflecting sequela of prior granulomatous disease. 13 mm right thyroid lobe nodule, not meeting consensus criteria for ultrasound follow-up based on size. Upper chest: No consolidation within the imaged lung apices. Review of the MIP images confirms the above findings CTA HEAD FINDINGS Anterior circulation: The intracranial internal carotid arteries are patent. Calcified plaque within both vessels. No more than mild stenosis on the right. Up to moderate stenosis within the paraclinoid left ICA. The M1 middle cerebral arteries are patent. Focus of calcified plaque within the distal M1 right middle cerebral artery. Blooming artifact from calcified plaque limits quantification of stenosis at this site. However, this plaque appears to have been present dating back to the prior noncontrast head CT of 07/16/2009. No M2 proximal branch occlusion is identified. Atherosclerotic irregularity of the M2 and more distal middle cerebral arteries bilaterally. Most notably, there is a moderate stenosis at the origin of an M2 right MCA vessel (series 11, image 264) (series 12, image 52).  Mild dolichoectasia of the supraclinoid ICAs (greater on the left) and of the proximal M1 left middle cerebral artery. Posterior circulation: The dominant right vertebral artery is patent intracranially. Mild nonstenotic calcified plaque within this vessel. The majority of the intracranial left vertebral artery is occluded (apart from its most distal aspect just proximal to the vertebrobasilar junction). The basilar artery is patent. Mild atherosclerotic irregularity of this vessel without significant stenosis. Additionally, there is a 1-2 mm ventrally projecting vascular protrusion arising from the mid basilar artery which could reflect a small aneurysm (series 11, image 237). The posterior cerebral arteries are patent. Fetal origin left PCA. 2 mm medially projecting vascular protrusion arising from the origin of the fetal left posterior cerebral artery, which could reflect a small aneurysm (series 11, image 252). Mild stenosis of the right PCA at the P1/P2 junction. The right posterior communicating artery is hypoplastic or absent. Venous sinuses: Within the limitations of contrast timing, no convincing thrombus. Anatomic variants: As described Review of the MIP images confirms the above  findings IMPRESSION: CT head: 1. No evidence of acute intracranial hemorrhage or acute infarction. 2. Redemonstrated parenchymal atrophy and chronic small vessel ischemic disease with chronic lacunar infarcts, as described. 3. 14 mm lucent focus within the right frontoparietal calvarium, as described. Although nonspecific, this is a new finding as compared to the head CT of 07/16/2009 and worrisome etiologies (such as an osseous metastatic lesion) cannot be excluded. Clinical correlation is recommended. Additionally, short interval 3-6 month CT follow-up should be considered. CTA neck: 1. Age-indeterminate occlusion of the non-dominant left vertebral artery at the V3 segment and intracranially. 2. The dominant right vertebral  artery is patent within the neck. Mild atherosclerotic stenosis at the origin of this vessel. 3. The common carotid and internal carotid arteries are patent within the neck. Apparent 60-70% stenosis at the origin of the right ICA (although this apparent stenosis could be exaggerated by blooming artifact from calcified plaque at this site). Consider a carotid artery duplex for further evaluation. No hemodynamically significant stenosis of the left CCA or ICA within the neck. CTA head: 1. The non dominant left vertebral artery is occluded intracranially. 2. Intracranial atherosclerotic disease with multifocal stenoses, most notably as follows. 3. Moderate stenosis of the paraclinoid left ICA. 4. Focus of calcified plaque within the distal M1 right middle cerebral artery. Blooming artifact from the calcified plaque limits quantification of stenosis at this site. However, this plaque appears to have been present dating back to the non-contrast head CT of 07/16/2009. 5. 1-2 mm ventrally projecting vascular protrusion arising from the mid basilar artery, which could reflect a small aneurysm. 6. 2 mm medially projecting vascular protrusion at the origin of a fetal left posterior cerebral artery, which could also reflect a small aneurysm. Electronically Signed   By: Jackey LogeKyle  Golden DO   On: 02/27/2021 12:59   CT Head Wo Contrast  Result Date: 02/26/2021 CLINICAL DATA:  Altered mental status. EXAM: CT HEAD WITHOUT CONTRAST TECHNIQUE: Contiguous axial images were obtained from the base of the skull through the vertex without intravenous contrast. COMPARISON:  July 16, 2009 FINDINGS: Brain: There is moderate severity cerebral atrophy with widening of the extra-axial spaces and ventricular dilatation. There are areas of decreased attenuation within the white matter tracts of the supratentorial brain, consistent with microvascular disease changes. Tiny chronic bilateral basal ganglia lacunar infarcts are seen. Vascular: No  hyperdense vessel or unexpected calcification. Skull: Bilateral posterior parietal burr holes are seen. These are present on the prior exam. Sinuses/Orbits: No acute finding. Other: None. IMPRESSION: 1. Generalized cerebral atrophy. 2. No acute intracranial abnormality. Electronically Signed   By: Aram Candelahaddeus  Houston M.D.   On: 02/26/2021 21:58   MR BRAIN WO CONTRAST  Result Date: 02/27/2021 CLINICAL DATA:  Transient ischemic attack EXAM: MRI HEAD WITHOUT CONTRAST TECHNIQUE: Multiplanar, multiecho pulse sequences of the brain and surrounding structures were obtained without intravenous contrast. COMPARISON:  None. FINDINGS: Brain: No acute infarct, mass effect or extra-axial collection. No acute or chronic hemorrhage. There is multifocal hyperintense T2-weighted signal within the white matter. Generalized volume loss without a clear lobar predilection. The midline structures are normal. Vascular: Major flow voids are preserved. Skull and upper cervical spine: Normal calvarium and skull base. Visualized upper cervical spine and soft tissues are normal. Sinuses/Orbits:No paranasal sinus fluid levels or advanced mucosal thickening. No mastoid or middle ear effusion. Normal orbits. IMPRESSION: 1. No acute intracranial abnormality. 2. Findings of chronic small vessel ischemia and generalized volume loss. Electronically Signed   By: Deatra RobinsonKevin  Herman  M.D.   On: 02/27/2021 02:17   DG Chest Port 1 View  Result Date: 02/26/2021 CLINICAL DATA:  Dyspnea EXAM: PORTABLE CHEST 1 VIEW COMPARISON:  05/03/2012 chest radiograph. FINDINGS: Stable cardiomediastinal silhouette with mild cardiomegaly. No pneumothorax. No pleural effusion. Mild pulmonary edema. No acute consolidative airspace disease. IMPRESSION: Mild congestive heart failure. Electronically Signed   By: Delbert Phenix M.D.   On: 02/26/2021 22:02   ECHOCARDIOGRAM COMPLETE  Result Date: 02/27/2021    ECHOCARDIOGRAM REPORT   Patient Name:   Samantha Cooper Date of Exam:  02/27/2021 Medical Rec #:  [id number]        Height:       64.0 in Accession #:    [id number]       Weight:       264.8 lb Date of Birth:  [birth year]        BSA:          2.204 m Patient Age:    85 years         BP:           153/67 mmHg Patient Gender: F                HR:           72 bpm. Exam Location:  [facility] Procedure: 2D Echo, Cardiac Doppler and Color Doppler Indications:     TIA G45.9  History:         Patient has no prior history of Echocardiogram examinations.                  Risk Factors:Hypertension.  Sonographer:     Cristela Blue RDCS (AE) Referring Phys:  [id number] Andris Baumann Diagnosing Phys: Adrian Blackwater MD  Sonographer Comments: Technically difficult study due to poor echo windows, no parasternal window, suboptimal subcostal window and suboptimal apical window. IMPRESSIONS  1. Left ventricular ejection fraction, by estimation, is 60 to 65%. The left ventricle has normal function. The left ventricle has no regional wall motion abnormalities. There is moderate left ventricular hypertrophy. Left ventricular diastolic parameters are consistent with Grade I diastolic dysfunction (impaired relaxation).  2. Right ventricular systolic function is mildly reduced. The right ventricular size is moderately enlarged.  3. Left atrial size was moderately dilated.  4. Right atrial size was moderately dilated.  5. The mitral valve is normal in structure. No evidence of mitral valve regurgitation. No evidence of mitral stenosis.  6. The aortic valve is normal in structure. Aortic valve regurgitation is not visualized. Mild aortic valve sclerosis is present, with no evidence of aortic valve stenosis.  7. The inferior vena cava is normal in size with greater than 50% respiratory variability, suggesting right atrial pressure of 3 mmHg. FINDINGS  Left Ventricle: Left ventricular ejection fraction, by estimation, is 60 to 65%. The left ventricle has normal function. The left ventricle has no regional wall motion  abnormalities. The left ventricular internal cavity size was normal in size. There is  moderate left ventricular hypertrophy. Left ventricular diastolic parameters are consistent with Grade I diastolic dysfunction (impaired relaxation). Right Ventricle: The right ventricular size is moderately enlarged. No increase in right ventricular wall thickness. Right ventricular systolic function is mildly reduced. Left Atrium: Left atrial size was moderately dilated. Right Atrium: Right atrial size was moderately dilated. Pericardium: There is no evidence of pericardial effusion. Mitral Valve: The mitral valve is normal in structure. No evidence of mitral valve regurgitation. No evidence of mitral valve stenosis. Tricuspid Valve:  The tricuspid valve is normal in structure. Tricuspid valve regurgitation is not demonstrated. No evidence of tricuspid stenosis. Aortic Valve: The aortic valve is normal in structure. Aortic valve regurgitation is not visualized. Mild aortic valve sclerosis is present, with no evidence of aortic valve stenosis. Aortic valve mean gradient measures 2.0 mmHg. Aortic valve peak gradient measures 2.9 mmHg. Aortic valve area, by VTI measures 4.50 cm. Pulmonic Valve: The pulmonic valve was normal in structure. Pulmonic valve regurgitation is not visualized. No evidence of pulmonic stenosis. Aorta: The aortic root is normal in size and structure. Venous: The inferior vena cava is normal in size with greater than 50% respiratory variability, suggesting right atrial pressure of 3 mmHg. IAS/Shunts: No atrial level shunt detected by color flow Doppler.  LEFT VENTRICLE PLAX 2D LVIDd:         3.83 cm  Diastology LVIDs:         2.37 cm  LV e' medial:    4.46 cm/s LV PW:         1.56 cm  LV E/e' medial:  10.0 LV IVS:        1.07 cm  LV e' lateral:   6.31 cm/s LVOT diam:     2.10 cm  LV E/e' lateral: 7.1 LV SV:         72 LV SV Index:   33 LVOT Area:     3.46 cm  RIGHT VENTRICLE RV Basal diam:  4.40 cm RV S  prime:     13.60 cm/s TAPSE (M-mode): 3.6 cm LEFT ATRIUM             Index       RIGHT ATRIUM           Index LA diam:        4.20 cm 1.91 cm/m  RA Area:     15.30 cm LA Vol (A2C):   28.5 ml 12.93 ml/m RA Volume:   41.00 ml  18.60 ml/m LA Vol (A4C):   42.1 ml 19.10 ml/m LA Biplane Vol: 37.8 ml 17.15 ml/m  AORTIC VALVE AV Area (Vmax):    3.45 cm AV Area (Vmean):   3.56 cm AV Area (VTI):     4.50 cm AV Vmax:           84.60 cm/s AV Vmean:          55.100 cm/s AV VTI:            0.160 m AV Peak Grad:      2.9 mmHg AV Mean Grad:      2.0 mmHg LVOT Vmax:         84.20 cm/s LVOT Vmean:        56.600 cm/s LVOT VTI:          0.208 m LVOT/AV VTI ratio: 1.30  AORTA Ao Root diam: 2.70 cm MITRAL VALVE               TRICUSPID VALVE MV Area (PHT): 2.73 cm    TR Peak grad:   8.6 mmHg MV Decel Time: 278 msec    TR Vmax:        147.00 cm/s MV E velocity: 44.60 cm/s MV A velocity: 79.30 cm/s  SHUNTS MV E/A ratio:  0.56        Systemic VTI:  0.21 m  Systemic Diam: 2.10 cm Adrian Blackwater MD Electronically signed by Adrian Blackwater MD Signature Date/Time: 02/27/2021/9:05:40 AM    Final     Management plans discussed with the patient, family and they are in agreement.  CODE STATUS:     Code Status Orders  (From admission, onward)         Start     Ordered   02/26/21 2318  Full code  Continuous        02/26/21 2320        Code Status History    This patient has a current code status but no historical code status.   Advance Care Planning Activity      TOTAL TIME TAKING CARE OF THIS PATIENT: 45 minutes.    Alford Highland M.D on 02/27/2021 at 5:52 PM  Between 7am to 6pm - Pager - [phone]  After 6pm go to [email or web address] - password [email or web address]  [facility]  CC: Primary care physician; System, Provider Not In

## 2021-02-27 NOTE — ED Notes (Signed)
Pt returned from MRI °

## 2021-02-27 NOTE — Evaluation (Signed)
Physical Therapy Evaluation Patient Details Name: Samantha Cooper MRN: [id number] DOB: 1935-03-06 Today's Date: 02/27/2021   History of Present Illness  86 y.o. female with medical history significant for  HTN, class III obesity, ambulant with a walker and who takes' fluid pills' for unknown condition presenting with altered mental status.  Pt time of exam it appears that her recent slurred speech has improved, but still having confusion/AMS.  Clinical Impression  Pt with general confusion t/o the session but was able to follow simple commands, though she struggled with processing anything but the very basic.  She was able to ambulate 35-40 ft with walker but overall is considerably limited from her baseline (daughter reports she has not been very active at all the last week).  She did not need a lot of assist to get to sitting from supine, but could not get LEs back into bed and did need assist with that.  Pt will need HHPT and consistent supervision, but with this she should be safe at home once medically ready for d/c.      Follow Up Recommendations Home health PT;Supervision/Assistance - 24 hour    Equipment Recommendations  None recommended by PT    Recommendations for Other Services       Precautions / Restrictions Precautions Precautions: Fall Restrictions Weight Bearing Restrictions: No      Mobility  Bed Mobility Overal bed mobility: Needs Assistance Bed Mobility: Supine to Sit;Sit to Supine     Supine to sit: Min assist Sit to supine: Mod assist   General bed mobility comments: Pt make good effort with transitions, she needed only light assist to get to EOB (though she needed extra time and reinforcement to stay on task).  Pt made good effort with getting back into bed but did need assist to get b/l LEs back into bed and positioned appropriate.    Transfers Overall transfer level: Needs assistance Equipment used: Rolling walker (2 wheeled) Transfers: Sit to/from  Stand Sit to Stand: Min assist         General transfer comment: cuing for UE use and set up, able to rise slowly with heavy UE use but no direct assist needed  Ambulation/Gait Ambulation/Gait assistance: Min guard Gait Distance (Feet): 40 Feet Assistive device: Rolling walker (2 wheeled)       General Gait Details: Pt was able to perform slow but safe in-room ambulation with multiple turns.  Cuing for safety, walker use, directions but no direct assist needed and no overt LOBs or safety issues.  Stairs            Wheelchair Mobility    Modified Rankin (Stroke Patients Only)       Balance Overall balance assessment: Needs assistance Sitting-balance support: Single extremity supported Sitting balance-Leahy Scale: Good     Standing balance support: Bilateral upper extremity supported Standing balance-Leahy Scale: Fair Standing balance comment: reliant on walker and slow with transitions but no LOBs with standing/walking                             Pertinent Vitals/Pain Pain Assessment: 0-10 Pain Score: 3  Pain Location: general global pain, no where specific    Home Living Family/patient expects to be discharged to:: Private residence Living Arrangements: Children Available Help at Discharge: Available PRN/intermittently (daughter (present) reports they could temporarily provide 24/7 assist if needed)   Home Access: Stairs to enter Entrance Stairs-Rails: None Entrance Stairs-Number of Steps: 3 (  apparently she can exit the home w/o steps and typically walks down a gentle grassy slope to vehicle) Home Layout: One level        Prior Function Level of Independence: Independent with assistive device(s)         Comments: Pt uses 4WW for most mobility, rarely out of the house apart from MD appointments     Hand Dominance        Extremity/Trunk Assessment   Upper Extremity Assessment Upper Extremity Assessment: Generalized weakness (unable  to elevate shoudlers >100)    Lower Extremity Assessment Lower Extremity Assessment: Generalized weakness (equal bilaterally)       Communication   Communication: No difficulties  Cognition Arousal/Alertness: Awake/alert Behavior During Therapy: Flat affect Overall Cognitive Status: Impaired/Different from baseline                                 General Comments: Pt could neither state year of birth or current year, knew she was at [facility] but not why.  Per daughter she has improved some but is still very far from her baseline.      General Comments      Exercises     Assessment/Plan    PT Assessment Patient needs continued PT services  PT Problem List Decreased strength;Decreased range of motion;Decreased activity tolerance;Decreased balance;Decreased mobility;Decreased coordination;Decreased cognition;Decreased knowledge of use of DME;Decreased safety awareness;Decreased knowledge of precautions       PT Treatment Interventions Therapeutic activities;Therapeutic exercise;DME instruction;Gait training;Stair training;Functional mobility training;Balance training;Cognitive remediation;Patient/family education;Neuromuscular re-education    PT Goals (Current goals can be found in the Care Plan section)  Acute Rehab PT Goals Patient Stated Goal: go home PT Goal Formulation: With patient Time For Goal Achievement: 03/13/21 Potential to Achieve Goals: Fair    Frequency Min 2X/week   Barriers to discharge        Co-evaluation               AM-PAC PT "6 Clicks" Mobility  Outcome Measure Help needed turning from your back to your side while in a flat bed without using bedrails?: A Little Help needed moving from lying on your back to sitting on the side of a flat bed without using bedrails?: A Little Help needed moving to and from a bed to a chair (including a wheelchair)?: A Little Help needed standing up from a chair using your arms (e.g., wheelchair or  bedside chair)?: A Little Help needed to walk in hospital room?: A Little Help needed climbing 3-5 steps with a railing? : A Lot 6 Click Score: 17    End of Session Equipment Utilized During Treatment: Gait belt Activity Tolerance: Patient limited by fatigue;Patient tolerated treatment well Patient left: with call bell/phone within reach;with family/visitor present;in bed Nurse Communication: Mobility status PT Visit Diagnosis: Muscle weakness (generalized) (M62.81);Difficulty in walking, not elsewhere classified (R26.2)    Time: 4665-9935 PT Time Calculation (min) (ACUTE ONLY): 32 min   Charges:   PT Evaluation $PT Eval Low Complexity: 1 Low PT Treatments $Gait Training: 8-22 mins        Malachi Pro, DPT 02/27/2021, 11:30 AM

## 2021-02-27 NOTE — Consult Note (Incomplete)
Neurology Curbside Reason for Consult: AMS  Requesting Physician: Alford Highland   History is obtained from:  chart review  Please note patient has a linked chart (MRN [id number])  HPI: Samantha Cooper is a 86 y.o. female with a past medical history significant for coronary artery disease (s/p stent 02/2010), hypertension, hyperlipidemia, chronic diastolic heart failure, chronic lower extremity edema (multifactorial, venous insufficiency, heart failure and amlodipine which has been discontinued), arthritis, obesity (BMI 45.45).  At baseline, she lives independently, ambulates with a walker, and does get regular check ins from her children.  About 5 days prior to admission she was noted to have some slurred speech but was otherwise well.  On the day of admission she again had slurred speech and appeared confused with silverware spread all over the floor.  Vital signs on admission were notable for blood pressure 131/87, 96% on room air, heart rate 60s to 90s, afebrile patient.  She was started on ceftriaxone for presumed UTI based on her UA as below  Per chart review her Plavix was stopped by cardiology in 01/2020 and she continued on aspirin monotherapy, however on pharmacy review this medication remains on her list at this time   Past Medical History:  Diagnosis Date  . HTN (hypertension)   -Please see details in HPI above  Current Outpatient Medications  Medication Instructions  . aspirin EC 81 mg, Oral, Daily  . atorvastatin (LIPITOR) 40 mg, Oral, Daily  . carvedilol (COREG) 3.125 mg, Oral, 2 times daily  . clopidogrel (PLAVIX) 75 mg, Oral, Daily  . Dexilant 60 mg, Oral, Daily  . losartan (COZAAR) 25 mg, Oral, Daily  . torsemide (DEMADEX) 100 mg, Oral, Daily    Family history: On review of linked chart, mother had a history of heart disease   Social History:  reports that she has never smoked. She has never used smokeless tobacco. She reports previous alcohol use. No history on  file for drug use.  I have reviewed labs in epic and the results pertinent to this consultation are: CMP notable for mildly elevated glucose at 106, mildly reduced GFR at 57, otherwise normal CBC notable for increased hemoglobin at 15.1, otherwise normal (baseline hemoglobin 13.6) UA notable for ketones, small leukocytes, rare bacteria, negative for an nitrates with very mild pyuria (white blood cells 6-10)  Discussed with Dr. Hilton Sinclair who felt that the patient's mental status is likely secondary to dehydration in the setting of torsemide, as well as UTI, on a background of dementia.    Recommendations: -CTA chest -Urine drug screen not performed on admission, attempting to add on this lab this morning -B12, thiamine, HIV, RPR for reversible causes of dementia work-up, can be followed up by outpatient physician  Brooke Dare MD-PhD [facility] [phone] [facility] coverage for [facility] is from 8 AM to 4 AM in-house and 4 PM to 8 PM by telephone/video. 8 PM to 8 AM emergent questions or overnight urgent questions should be addressed to Teleneurology On-call or [facility] neurohospitalist; contact information can be found on AMION

## 2021-02-27 NOTE — ED Notes (Signed)
PT at bedside.

## 2021-02-27 NOTE — ED Notes (Signed)
Family outside sitting on bench waiting to speak with hospitalist.

## 2021-02-27 NOTE — Progress Notes (Signed)
*  PRELIMINARY RESULTS* Echocardiogram 2D Echocardiogram has been performed.  Cristela Blue 02/27/2021, 8:38 AM

## 2021-02-27 NOTE — ED Notes (Signed)
Patient cleaned due to episode of urinary incontinence, brief changed and peri care. Bed linens changed. Purewick placed for urinary management and measurement. Bed locked in

## 2021-02-27 NOTE — ED Notes (Signed)
Patient transported to MRI 

## 2021-02-27 NOTE — Progress Notes (Signed)
SLP Cancellation Note  Patient Details Name: Samantha Cooper MRN: [id number] DOB: 21-Apr-1935   Cancelled treatment:       Reason Eval/Treat Not Completed:  (chart reviewed; consulted NSG then met w/ pt/Dtr). Pt denied any difficulty swallowing and is currently on a regular diet; tolerates swallowing pills w/ water per NSG report. Pt's "slureed speech" is resolved per Dtr, however, pt exhibits s/s of Cognitive decline c/b decreased Orientation(to events, surroundings), decreased attention during tasks but redirectable, and decreased awareness. She verbally responded in casual, polite, conversation and responded to basic questions re: self and followed 1-step commands most consistently. Verbal cues aided in increasing follow through and accuracy w/ basic tasks. Pt often smiled and chuckled during enggagement. Dtr present in room stated she was "not surprised" when the MD mentioned Cognitive decline, "Dementia", earlier this morning.  As lives alone w/ family only checking on her "usually a few times a week and assists with IADLs", it may be difficult to see pt's full needs/status. Pt should have full Supervision for safety in the home, including 24/7 care initially. Pt should f/u w/ Neurology for a formal assessment of Cognition, and further needs.  No further skilled Acute ST services indicated at this time. Recommend assessment by Neurology post D/C to aid in determining pt's Cognitive status and POC to address ADL needs and safety in the home; POC w/ family. Pt and Dtr agreed. NSG to reconsult if any change in status while admitted. MD updated on above and Dtr's questions. NSG to f/u w/ this.     Orinda Kenner, [place], [place] Speech Language Pathologist Rehab Services [phone] [facility] 02/27/2021, 12:24 PM

## 2021-02-27 NOTE — ED Notes (Signed)
Daughter at bedside.

## 2021-02-27 NOTE — Evaluation (Signed)
Occupational Therapy Evaluation Patient Details Name: Samantha Cooper MRN: [id number] DOB: [birth year] Today's Date: 02/27/2021    History of Present Illness 86 y.o. female with medical history significant for  HTN, class III obesity, ambulant with a walker and who takes' fluid pills' for unknown condition presenting with altered mental status.  Pt time of exam it appears that her recent slurred speech has improved, but still having confusion/AMS.   Clinical Impression   Pt seen for OT evaluation this date in setting of acute hospitalization d/t AMS. Pt presents this date with "slurred speech" appearing resolved per her daughter, but pt is noted to be moderately confused, requiring consistent tactile/verbal cues throughout assessment and treatment to sequence tasks. Pt is also perseverative on things she is motivated about such as the blankets or watching TV and required gentle re-direction throughout to attend to OT. Pt able to consistently follow 1-2 simple step commands, but requires increased cues/processing time with 3 steps and is essentially unsuccessful on 3/3 trials. Pt's daughter reports that she lives alone and typically tends to her own self care, but family checks on her usually a few times a week and assists with IADLs. This date, pt requires MIN A for bed mobility and transfers as well as MIN A for LB ADLs and SETUP/cues to sequence seated UB ADLs. Pt is only oriented to self and some aspects of location, but not oriented to temporal concepts including her own birthday. Will continue to follow acutely. Pt's daughter states that family will coordinate to rotate more frequent checks on the patient and can temporarily provide 24/7 assist while they develop a better plan. In this instance, pt can return home with HHOT f/u.     Follow Up Recommendations  Home health OT;Supervision/Assistance - 24 hour    Equipment Recommendations  3 in 1 bedside commode;Tub/shower seat    Recommendations  for Other Services       Precautions / Restrictions Precautions Precautions: Fall Restrictions Weight Bearing Restrictions: No      Mobility Bed Mobility Overal bed mobility: Needs Assistance Bed Mobility: Supine to Sit;Sit to Supine     Supine to sit: Min guard;Supervision;HOB elevated Sit to supine: Min assist   General bed mobility comments: MIN A for sit to sup to manage LEs back to bed    Transfers Overall transfer level: Needs assistance Equipment used: 1 person hand held assist Transfers: Sit to/from Stand Sit to Stand: Min assist         General transfer comment: cues for sequence/safety    Balance Overall balance assessment: Needs assistance Sitting-balance support: Single extremity supported Sitting balance-Leahy Scale: Good     Standing balance support: Bilateral upper extremity supported Standing balance-Leahy Scale: Fair Standing balance comment: reliant on UE support                           ADL either performed or assessed with clinical judgement   ADL Overall ADL's : Needs assistance/impaired                                       General ADL Comments: SETUP and cues to sequence seated UB ADLs, MIN A for seated LB ADLs d/t some limited LE ROM 2/2 c/o feeling "swollen"     Vision Patient Visual Report: No change from baseline Additional Comments: unable to formally assess d/t cognition, but  overall appropriate with tracking therapist in the room.     Perception     Praxis      Pertinent Vitals/Pain Pain Assessment: Faces Pain Score: 3  Faces Pain Scale: Hurts a little bit Pain Location: general global pain, does not designate a specific spot, but does c/o R LE swollen Pain Descriptors / Indicators: Discomfort Pain Intervention(s): Monitored during session     Hand Dominance     Extremity/Trunk Assessment Upper Extremity Assessment Upper Extremity Assessment: Generalized weakness (limited shoulder  flexion (to 1/2 range), grip MMT ~4-/5)   Lower Extremity Assessment Lower Extremity Assessment: Defer to PT evaluation;Generalized weakness       Communication Communication Communication: No difficulties   Cognition Arousal/Alertness: Awake/alert Behavior During Therapy: Flat affect Overall Cognitive Status: Impaired/Different from baseline                                 General Comments: Pt's daughter does endorse some light confusion/only rote conversations generally when family is stopping in to check on her, but states that she appears to be cognitively worse than her baseline at this time. Pt follows 1-2 simple step commands pretty consistently, but cannot consistently follow 3 steps. She is not oriented to date, location, or situation. She does eventually state "[facility]" with prompting. Oriented to self, but not birthdate.   General Comments       Exercises Other Exercises Other Exercises: OT facilitates ed with pt's daughter re: noting pt's cognition at home r/t I/ADLs including sequence of steps to prepare food safely, sequence of steps to safely bathe, noting pt's ability to spot hazards in the home such as fall hazards or something dangerous such as a water leak.   Shoulder Instructions      Home Living Family/patient expects to be discharged to:: Private residence Living Arrangements: Children Available Help at Discharge: Available PRN/intermittently (daughter reports they could temporarily provide 24/7 assist if needed.) Type of Home: House Home Access: Stairs to enter [employer] of Steps: 3 (apparently she can exit the home w/o steps and typically walks down a gentle grassy slope to vehicle) Entrance Stairs-Rails: None Home Layout: One level               Home Equipment: None          Prior Functioning/Environment Level of Independence: Independent with assistive device(s)        Comments: Pt uses 4WW for most mobility, rarely  out of the house apart from MD appointments        OT Problem List: Decreased strength;Decreased activity tolerance;Decreased cognition;Decreased safety awareness;Increased edema      OT Treatment/Interventions: Self-care/ADL training;DME and/or AE instruction;Therapeutic activities;Cognitive remediation/compensation;Patient/family education    OT Goals(Current goals can be found in the care plan section) Acute Rehab OT Goals Patient Stated Goal: go home and arrange for family to rotate checking in on patient regularly OT Goal Formulation: With family Time For Goal Achievement: 03/13/21 Potential to Achieve Goals: Good ADL Goals Pt Will Perform Lower Body Dressing: with supervision;sit to/from stand Pt Will Transfer to Toilet: with supervision;ambulating;grab bars (with LRAD to/from restroom) Additional ADL Goal #1: Pt will identify at least 2/5 hazards in a room with <10% cues Additional ADL Goal #2: Pt will demo safe use of AD for fxl mobility to complete HH distance such as to/from restroom with <10% verbal cues.  OT Frequency:     Barriers to D/C:  Co-evaluation              AM-PAC OT "6 Clicks" Daily Activity     Outcome Measure Help from another person eating meals?: None Help from another person taking care of personal grooming?: None Help from another person toileting, which includes using toliet, bedpan, or urinal?: A Lot Help from another person bathing (including washing, rinsing, drying)?: A Lot Help from another person to put on and taking off regular upper body clothing?: A Little Help from another person to put on and taking off regular lower body clothing?: A Little 6 Click Score: 18   End of Session Equipment Utilized During Treatment: Gait belt Nurse Communication: Other (comment) (RN notified that pt wants to speak to MD regarding pt's cognition and possibility of dementia.)  Activity Tolerance: Patient tolerated treatment well Patient  left: in bed;with call bell/phone within reach  OT Visit Diagnosis: Unsteadiness on feet (R26.81);Muscle weakness (generalized) (M62.81);Other symptoms and signs involving cognitive function                Time: 1040-1053 OT Time Calculation (min): 13 min Charges:  OT General Charges $OT Visit: 1 Visit OT Treatments $Self Care/Home Management : 8-22 mins  Rejeana Brock, MS, OTR/L ascom [phone] 02/27/21, 11:57 AM

## 2021-02-28 LAB — RPR: RPR Ser Ql: NONREACTIVE

## 2021-03-01 LAB — URINE CULTURE: Culture: >=100000 — AB

## 2021-03-04 ENCOUNTER — Emergency Department
Admission: EM | Admit: 2021-03-04 | Discharge: 2021-03-28 | Disposition: E | Payer: Medicare Other | Attending: Emergency Medicine | Admitting: Emergency Medicine

## 2021-03-04 DIAGNOSIS — Z79899 Other long term (current) drug therapy: Secondary | ICD-10-CM | POA: Insufficient documentation

## 2021-03-04 DIAGNOSIS — I10 Essential (primary) hypertension: Secondary | ICD-10-CM | POA: Diagnosis not present

## 2021-03-04 DIAGNOSIS — Z7982 Long term (current) use of aspirin: Secondary | ICD-10-CM | POA: Insufficient documentation

## 2021-03-04 DIAGNOSIS — R0689 Other abnormalities of breathing: Secondary | ICD-10-CM | POA: Diagnosis not present

## 2021-03-04 DIAGNOSIS — R000 Tachycardia, unspecified: Secondary | ICD-10-CM | POA: Diagnosis not present

## 2021-03-04 DIAGNOSIS — Z743 Need for continuous supervision: Secondary | ICD-10-CM | POA: Diagnosis not present

## 2021-03-04 DIAGNOSIS — R7303 Prediabetes: Secondary | ICD-10-CM | POA: Insufficient documentation

## 2021-03-04 DIAGNOSIS — I499 Cardiac arrhythmia, unspecified: Secondary | ICD-10-CM | POA: Diagnosis not present

## 2021-03-04 DIAGNOSIS — I2510 Atherosclerotic heart disease of native coronary artery without angina pectoris: Secondary | ICD-10-CM | POA: Diagnosis not present

## 2021-03-04 DIAGNOSIS — I469 Cardiac arrest, cause unspecified: Secondary | ICD-10-CM | POA: Insufficient documentation

## 2021-03-04 DIAGNOSIS — R404 Transient alteration of awareness: Secondary | ICD-10-CM | POA: Diagnosis not present

## 2021-03-05 ENCOUNTER — Inpatient Hospital Stay: Payer: Medicare Other | Admitting: Internal Medicine

## 2021-03-09 ENCOUNTER — Telehealth: Payer: Self-pay | Admitting: Cardiovascular Disease

## 2021-03-09 NOTE — Telephone Encounter (Signed)
[facility]eral home calling to discuss DC.    Advised to reach out to PCP office for assistance.

## 2021-03-12 LAB — VITAMIN B1: Vitamin B1 (Thiamine): 117.9 nmol/L (ref 66.5–200.0)

## 2021-03-13 NOTE — Telephone Encounter (Signed)
Patient was last seen in our office 08/2020. Her primary cardiologist is Dr. Kirke Corin, I will CC him to make him aware of her passing.   She was seen in ED 03-25-21 after presenting via EMS for witnessed cardiac arrest. Her time of death was called by Dr. Shaune Pollack in the ED on 03/25/21 at 2115 per documentation. As such, ED provider should sign death certificate.   Alver Sorrow, NP

## 2021-03-13 NOTE — Telephone Encounter (Signed)
[facility] calling to ask if Samantha Cooper last ov in system could sign death certificate.  pcp declined and ed physician that pronounced declined.    Please advise.

## 2021-03-13 NOTE — Telephone Encounter (Signed)
Spoke with Samantha Cooper at [facility] and advised that our APP's are not able to sign death certificates. Advised that Dr. Shaune Pollack pronounced her death in the ED on Mar 14, 2021 and he would be the one who signs that death certificate. She was appreciative for the information with no further questions at this time.

## 2021-03-15 ENCOUNTER — Ambulatory Visit: Payer: Medicare Other | Admitting: Cardiovascular Disease

## 2021-03-28 DIAGNOSIS — 419620001 Death: Secondary | SNOMED CT | POA: Diagnosis not present

## 2021-03-28 NOTE — ED Provider Notes (Signed)
[facility] Emergency Department Provider Note  ____________________________________________   Event Date/Time   First MD Initiated Contact with Patient 03/07/2021 2116     (approximate)  I have reviewed the triage vital signs and the nursing notes.   HISTORY  Chief Complaint Cardiac Arrest    HPI Samantha Cooper is a 86 y.o. female with history of hypertension, hyperlipidemia, obesity, coronary disease, here with cardiac arrest.  The patient had a witnessed cardiac arrest at home.  EMS was called immediately to the scene.  On arrival, patient was in asystole arrest.  She had 1 very brief return of circulation less than 2 to 3 minutes then has been in asystole for at least the last 40 minutes prior to my assessment.  She has received numerous doses of epinephrine.  She has had no spontaneous movement beyond occasional agonal respirations.  Due to these agonal respirations, she was transported to the ED for confirmation of asystole as well as cardiac arrest.  She has remained asystolic in route.  No regular spontaneous respirations noted.  No neurological activity noted.        Past Medical History:  Diagnosis Date  . Heart disease   . History of heart attack   . HTN (hypertension)   . Hyperlipidemia   . Osteoarthritis 09/26/2017  . Oxygen deficiency     Patient Active Problem List   Diagnosis Date Noted  . Stenosis of right carotid artery   . Brain aneurysm   . Memory loss   . Impaired fasting glucose   . AMS (altered mental status) 02/26/2021  . Essential hypertension 02/26/2021  . Obesity, Class III, BMI 40-49.9 (morbid obesity) (HCC) 02/26/2021  . Acute metabolic encephalopathy 02/26/2021  . Walker as ambulation aid 02/26/2021  . UTI (urinary tract infection) 02/26/2021  . Impaired gait and mobility 10/23/2020  . Prediabetes 04/07/2020  . Coronary artery disease involving native coronary artery of native heart without angina pectoris  07/21/2019  . Morbid obesity with BMI of 40.0-44.9, adult (HCC) 09/26/2017  . Hypertension 09/26/2017  . Osteoarthritis 09/26/2017  . Hyperlipidemia 09/26/2017  . GERD (gastroesophageal reflux disease) 09/26/2017  . Hypoxia 09/26/2017    Past Surgical History:  Procedure Laterality Date  . TUBAL LIGATION      Prior to Admission medications   Medication Sig Start Date End Date Taking? Authorizing Provider  aspirin 81 MG tablet aspirin 81 mg tablet,delayed release  take 1 tablet by mouth once daily    [provider]  aspirin EC 81 MG tablet Take 81 mg by mouth daily.    [provider]  atorvastatin (LIPITOR) 40 MG tablet Take 1 tablet daily 12/04/20   Althea Charon, Netta Neat, DO  atorvastatin (LIPITOR) 40 MG tablet Take 40 mg by mouth daily.    [provider]  carvedilol (COREG) 3.125 MG tablet TAKE 1 TABLET(3.125 MG) BY MOUTH TWICE DAILY WITH A MEAL 10/26/20   Alver Sorrow, NP  carvedilol (COREG) 3.125 MG tablet Take 3.125 mg by mouth 2 (two) times daily.    [provider]  clopidogrel (PLAVIX) 75 MG tablet Take 1 tablet (75 mg total) by mouth daily. 06/19/20   Malfi, Jodelle Gross, FNP  clopidogrel (PLAVIX) 75 MG tablet Take 75 mg by mouth daily.    [provider]  DEXILANT 60 MG capsule Take 60 mg by mouth daily.    [provider]  dexlansoprazole (DEXILANT) 60 MG capsule Take 1 capsule (60 mg total) by mouth  daily. 11/30/20   Karamalegos, Netta Neat, DO  losartan (COZAAR) 25 MG tablet Take 1 tablet (25 mg total) by mouth daily. 06/19/20   Malfi, Jodelle Gross, FNP  losartan (COZAAR) 25 MG tablet Take 25 mg by mouth daily.    [provider]  torsemide (DEMADEX) 100 MG tablet TAKE 1 TABLET(100 MG) BY MOUTH DAILY 06/19/20   Malfi, Jodelle Gross, FNP  torsemide (DEMADEX) 20 MG tablet Take 1 tablet (20 mg total) by mouth daily. 02/28/21   Alford Highland, MD    Allergies Codeine  Family History  Problem Relation Age of Onset  .  Heart disease Mother     Social History Social History   Tobacco Use  . Smoking status: Never Smoker  . Smokeless tobacco: Never Used  . Tobacco comment: unsure when she quit, over 30 years ago   Substance Use Topics  . Alcohol use: Not Currently  . Drug use: No    Review of Systems  Review of Systems  Unable to perform ROS: Patient unresponsive     ____________________________________________  PHYSICAL EXAM:      VITAL SIGNS: ED Triage Vitals  Enc Vitals Group     BP      Pulse      Resp      Temp      Temp src      SpO2      Weight      Height      Head Circumference      Peak Flow      Pain Score      Pain Loc      Pain Edu?      Excl. in GC?      Physical Exam Constitutional:      Comments: Unresponsive, apneic,  HENT:     Head:     Comments: No apparent head trauma Cardiovascular:     Comments: No cardiac activity appreciated. Pulmonary:     Comments: Transmitted sounds from BVM.  No spontaneous respirations noted.  No air movement. Chest:     Comments: No obvious chest trauma Abdominal:     Comments: Obese, distended.  Soft.  Neurological:     Comments: No spontaneous respirations or response.  Corneal reflexes absent.  Pupils fixed and dilated.       ____________________________________________   LABS (all labs ordered are listed, but only abnormal results are displayed)  Labs Reviewed - No data to display  ____________________________________________  EKG:  ________________________________________  RADIOLOGY All imaging, including plain films, CT scans, and ultrasounds, independently reviewed by me, and interpretations confirmed via formal radiology reads.  ED MD interpretation:     Official radiology report(s): No results found.  ____________________________________________  PROCEDURES   Procedure(s) performed (including Critical Care):  Procedures  ____________________________________________  INITIAL IMPRESSION  / MDM / ASSESSMENT AND PLAN / ED COURSE  As part of my medical decision making, I reviewed the following data within the electronic MEDICAL RECORD NUMBER Nursing notes reviewed and incorporated, Old chart reviewed, Notes from prior ED visits, and [place] Controlled Substance Database       *ANESHA HACKERT was evaluated in Emergency Department on 2021-04-01 for the symptoms described in the history of present illness. She was evaluated in the context of the global COVID-19 pandemic, which necessitated consideration that the patient might be at risk for infection with the SARS-CoV-2 virus that causes COVID-19. Institutional protocols and algorithms that pertain to the evaluation of patients at risk for COVID-19  are in a state of rapid change based on information released by regulatory bodies including the CDC and federal and state organizations. These policies and algorithms were followed during the patient's care in the ED.  Some ED evaluations and interventions may be delayed as a result of limited staffing during the pandemic.*     Medical Decision Making: 86 year old female here with witnessed arrest.  On arrival, patient noted to have asystole on the monitor.  Bedside ultrasound shows no cardiac activity.  Patient has had prolonged downtime with greater than 40 minutes of asystole and no response despite maximum efforts by EMS.  Resuscitation terminated.  Family updated and allowed to see patient after time of death.  ____________________________________________  FINAL CLINICAL IMPRESSION(S) / ED DIAGNOSES  Final diagnoses:  Cardiac arrest [facility])     MEDICATIONS GIVEN DURING THIS VISIT:  Medications - No data to display   ED Discharge Orders    None       Note:  This document was prepared using Dragon voice recognition software and may include unintentional dictation errors.   Shaune Pollack, MD 03/17/2021 2303

## 2021-03-28 NOTE — Progress Notes (Signed)
Called to provide support to family of deceased patient. Attending and nurse made notification. Left two daughters bedside, please page me if additional support is needed.

## 2021-03-28 NOTE — ED Triage Notes (Signed)
Patient from home via [facility]. Upon arrival patient is bagged and EMS performing compressions.  EMS report: pt asystole x30 min PTA; achieved ROSC 5 min; family witnessed agonal breathing in field. PTA meds: 6 of Epi; 300 amio, calcium, and sodium bicarb.

## 2021-03-28 NOTE — ED Notes (Addendum)
CPR resumed by staff upon arrival. EDP bedside with [place]. Pulse check maintained asystole, verified pulse check with EDP.  Time of death called by Dr Erma Heritage, MD at 2115.   EKG printout of asytole in patient's chart.

## 2021-03-28 DEATH — deceased

## 2022-03-13 IMAGING — CT CT ANGIO HEAD-NECK (W OR W/O PERF)
1 of 10 series · 6 of 35 positions shown · IV contrast (omnipaque)
Comparison: Brain MRI performed earlier today 02/27/2021. Head CT
02/26/2021.

CLINICAL DATA: Altered mental status, unclear cause. Additional
history provided: Altered mental status for several days, unable to
follow commands, oriented x1.

EXAM:
CT ANGIOGRAPHY HEAD AND NECK
TECHNIQUE: Multidetector CT imaging of the head and neck was performed using
the standard protocol during bolus administration of intravenous
contrast. Multiplanar CT image reconstructions and MIPs were
obtained to evaluate the vascular anatomy. Carotid stenosis
measurements (when applicable) are obtained utilizing NASCET
criteria, using the distal internal carotid diameter as the
denominator.
CONTRAST:  75mL OMNIPAQUE IOHEXOL 350 MG/ML SOLN

[Series 11: ax thin · axial · 0.42mm/px · z∈[-333,-88]mm · 6 of 352 slices shown]
[im 51/352  soft-tissue]
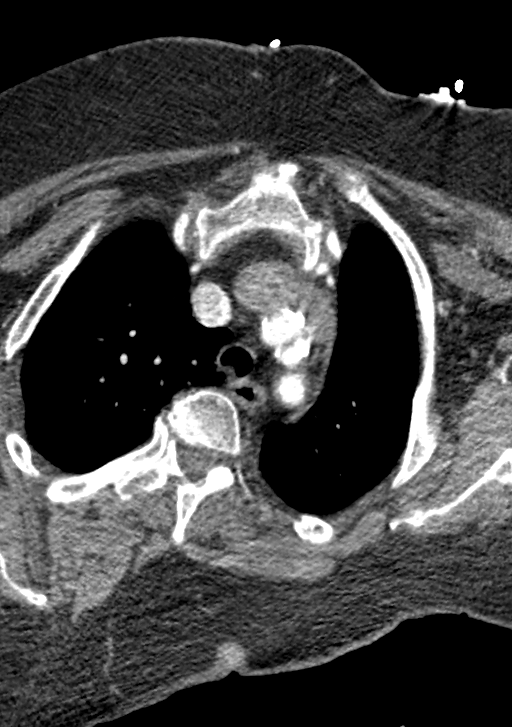
[im 101/352  bone]
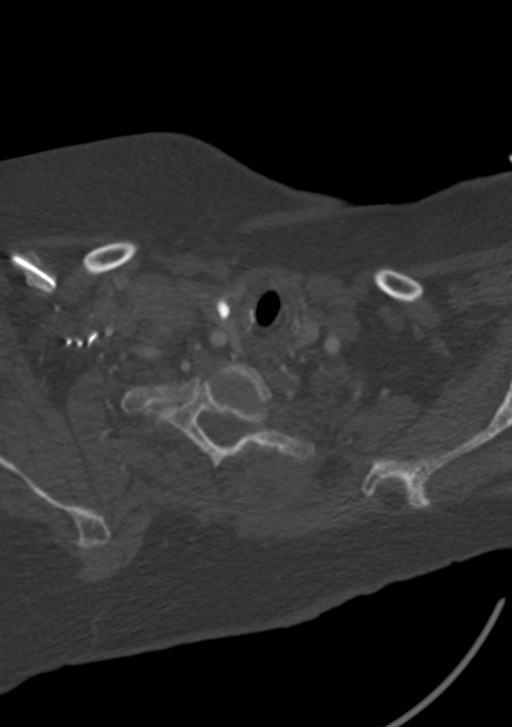
[im 151/352  soft-tissue]
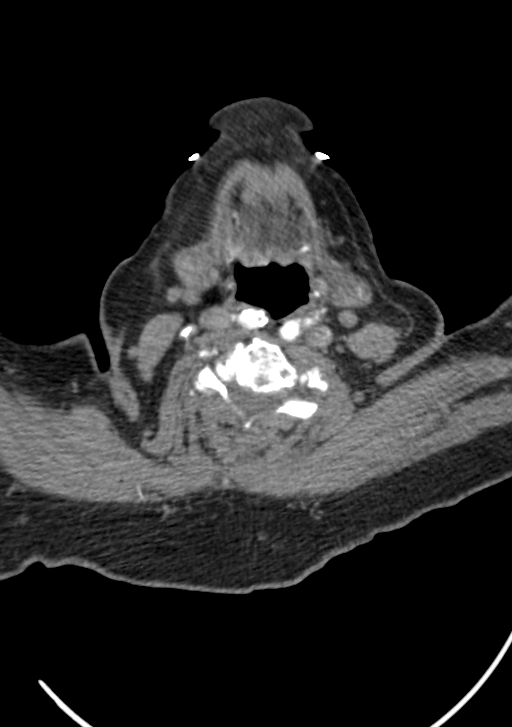
[im 201/352  bone]
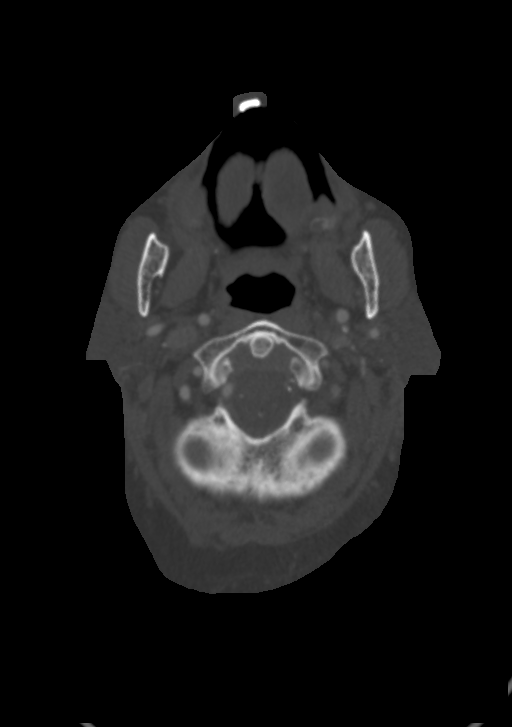
[im 251/352  soft-tissue]
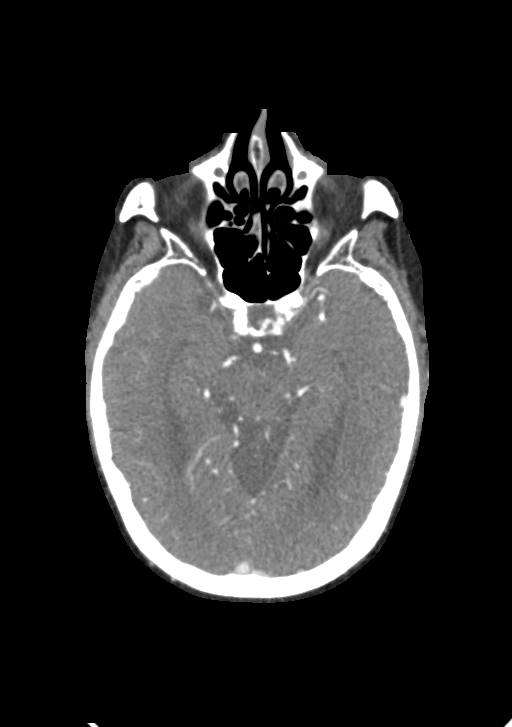
[im 301/352  bone]
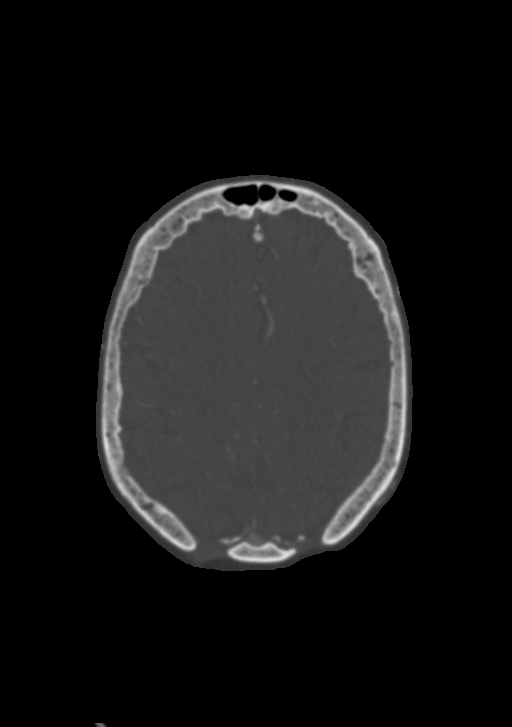

[6 of 35 positions shown; findings below may reference images not displayed]

FINDINGS: CT HEAD FINDINGS

Brain:

Mild cerebral atrophy.

Mild to moderate patchy and ill-defined hypoattenuation within the
cerebral white matter is nonspecific, but compatible with chronic
small vessel ischemic disease.

Redemonstrated chronic lacunar infarcts within the bilateral deep
gray nuclei.

There is no acute intracranial hemorrhage.

No demarcated cortical infarct.

No extra-axial fluid collection.

No evidence of intracranial mass.

No midline shift.

Vascular: No hyperdense vessel.  Atherosclerotic calcifications

Skull: No calvarial fracture. 1.4 cm lucent focus within the right
frontoparietal calvarium with asymmetric involvement of the inner
table. T2/FLAIR hyperintense signal and restricted diffusion was
present at this site on the brain MRI performed earlier today.

Sinuses: No significant paranasal sinus disease.

Orbits: No mass or acute finding.

Review of the MIP images confirms the above findings

CTA NECK FINDINGS

Aortic arch: Standard aortic branching. Atherosclerotic plaque
within the visualized aortic arch and proximal major branch vessels
of the neck. No hemodynamically significant innominate or proximal
subclavian artery stenosis.

Right carotid system: CCA and ICA patent within the neck. Soft and
calcified plaque within the carotid bifurcation and proximal ICA.
Apparent stenosis at the origin of the right ICA of 60-70% (however,
this apparent stenosis could potentially be exaggerated by blooming
artifact from calcified plaque at this site). Minimal calcified
plaques also present more distally within the cervical ICA.

Left carotid system: CCA and ICA patent within the neck without
significant stenosis (50% or greater). Mild to moderate calcified
plaque within the carotid bifurcation and proximal ICA. Mild
nonstenotic calcified plaque is also present more distally within
the cervical ICA.

Vertebral arteries: The right vertebral artery is dominant and
patent throughout the neck. Calcified plaque results in mild
stenosis at the origin of this vessel. The V1 and V2 segments of the
non dominant left vertebral artery are patent. There is progressive
non enhancement of the V3 left vertebral artery. The left vertebral
artery remains occluded intracranially.

Skeleton: Cervical spondylosis. No acute bony abnormality or
aggressive osseous lesion. Cervicothoracic levocurvature, which may
be positional.

Other neck: No neck mass. Calcified cervical lymph nodes, likely
reflecting sequela of prior granulomatous disease. 13 mm right
thyroid lobe nodule, not meeting consensus criteria for ultrasound
follow-up based on size.

Upper chest: No consolidation within the imaged lung apices.

Review of the MIP images confirms the above findings

CTA HEAD FINDINGS

Anterior circulation:

The intracranial internal carotid arteries are patent. Calcified
plaque within both vessels. No more than mild stenosis on the right.
Up to moderate stenosis within the paraclinoid left ICA. The M1
middle cerebral arteries are patent. Focus of calcified plaque
within the distal M1 right middle cerebral artery. Blooming artifact
from calcified plaque limits quantification of stenosis at this
site. However, this plaque appears to have been present dating back
to the prior noncontrast head CT of 07/16/2009. No M2 proximal
branch occlusion is identified. Atherosclerotic irregularity of the
M2 and more distal middle cerebral arteries bilaterally. Most
notably, there is a moderate stenosis at the origin of an M2 right
MCA vessel (series 11, image 264) (series 12, image 52). Mild
dolichoectasia of the supraclinoid ICAs (greater on the left) and of
the proximal M1 left middle cerebral artery.

Posterior circulation:

The dominant right vertebral artery is patent intracranially. Mild
nonstenotic calcified plaque within this vessel. The majority of the
intracranial left vertebral artery is occluded (apart from its most
distal aspect just proximal to the vertebrobasilar junction). The
basilar artery is patent. Mild atherosclerotic irregularity of this
vessel without significant stenosis. Additionally, there is a 1-2 mm
ventrally projecting vascular protrusion arising from the mid
basilar artery which could reflect a small aneurysm (series 11,
image 237). The posterior cerebral arteries are patent. Fetal origin
left PCA. 2 mm medially projecting vascular protrusion arising from
the origin of the fetal left posterior cerebral artery, which could
reflect a small aneurysm (series 11, image 252). Mild stenosis of
the right PCA at the P1/P2 junction. The right posterior
communicating artery is hypoplastic or absent.

Venous sinuses: Within the limitations of contrast timing, no
convincing thrombus.

Anatomic variants: As described

Review of the MIP images confirms the above findings
IMPRESSION: CT head:

1. No evidence of acute intracranial hemorrhage or acute infarction.
2. Redemonstrated parenchymal atrophy and chronic small vessel
ischemic disease with chronic lacunar infarcts, as described.
3. 14 mm lucent focus within the right frontoparietal calvarium, as
described. Although nonspecific, this is a new finding as compared
to the head CT of 07/16/2009 and worrisome etiologies (such as an
osseous metastatic lesion) cannot be excluded. Clinical correlation
is recommended. Additionally, short interval 3-6 month CT follow-up
should be considered.

CTA neck:

1. Age-indeterminate occlusion of the non-dominant left vertebral
artery at the V3 segment and intracranially.
2. The dominant right vertebral artery is patent within the neck.
Mild atherosclerotic stenosis at the origin of this vessel.
3. The common carotid and internal carotid arteries are patent
within the neck. Apparent 60-70% stenosis at the origin of the right
ICA (although this apparent stenosis could be exaggerated by
blooming artifact from calcified plaque at this site). Consider a
carotid artery duplex for further evaluation. No hemodynamically
significant stenosis of the left CCA or ICA within the neck.

CTA head:

1. The non dominant left vertebral artery is occluded
intracranially.
2. Intracranial atherosclerotic disease with multifocal stenoses,
most notably as follows.
3. Moderate stenosis of the paraclinoid left ICA.
4. Focus of calcified plaque within the distal M1 right middle
cerebral artery. Blooming artifact from the calcified plaque limits
quantification of stenosis at this site. However, this plaque
appears to have been present dating back to the non-contrast head CT
of 07/16/2009.
[DATE]. [DATE] mm ventrally projecting vascular protrusion arising from the
mid basilar artery, which could reflect a small aneurysm.
6. 2 mm medially projecting vascular protrusion at the origin of a
fetal left posterior cerebral artery, which could also reflect a
small aneurysm.
